# Patient Record
Sex: Female | Born: 1975 | Race: Black or African American | Hispanic: No | Marital: Single | Smoking: Current every day smoker
Health system: Southern US, Community
[De-identification: ages and names within clinical notes are randomized; demographics above are authoritative.]

## PROBLEM LIST (undated history)

## (undated) DIAGNOSIS — D649 Anemia, unspecified: Secondary | ICD-10-CM

## (undated) DIAGNOSIS — F419 Anxiety disorder, unspecified: Secondary | ICD-10-CM

## (undated) DIAGNOSIS — F4310 Post-traumatic stress disorder, unspecified: Secondary | ICD-10-CM

---

## 1998-02-05 DIAGNOSIS — D649 Anemia, unspecified: Secondary | ICD-10-CM

## 1998-02-05 HISTORY — DX: Anemia, unspecified: D64.9

## 2015-07-29 ENCOUNTER — Emergency Department
Admission: EM | Admit: 2015-07-29 | Discharge: 2015-07-29 | Disposition: A | Discharge status: Home / Self Care | Payer: Medicaid Other | Attending: Emergency Medicine | Admitting: Emergency Medicine

## 2015-07-29 ENCOUNTER — Emergency Department: Payer: Medicaid Other

## 2015-07-29 ENCOUNTER — Encounter: Payer: Self-pay | Admitting: Medical Oncology

## 2015-07-29 DIAGNOSIS — K8050 Calculus of bile duct without cholangitis or cholecystitis without obstruction: Secondary | ICD-10-CM | POA: Insufficient documentation

## 2015-07-29 DIAGNOSIS — R1011 Right upper quadrant pain: Secondary | ICD-10-CM | POA: Diagnosis present

## 2015-07-29 DIAGNOSIS — R109 Unspecified abdominal pain: Secondary | ICD-10-CM

## 2015-07-29 HISTORY — DX: Anemia, unspecified: D64.9

## 2015-07-29 LAB — URINALYSIS COMPLETE WITH MICROSCOPIC (ARMC ONLY)
Bacteria, UA: NONE SEEN
Bilirubin Urine: NEGATIVE
Glucose, UA: NEGATIVE mg/dL
KETONES UR: NEGATIVE mg/dL
Nitrite: NEGATIVE
PH: 5 (ref 5.0–8.0)
PROTEIN: NEGATIVE mg/dL
Specific Gravity, Urine: 1.025 (ref 1.005–1.030)

## 2015-07-29 LAB — COMPREHENSIVE METABOLIC PANEL
ALT: 22 U/L (ref 14–54)
ANION GAP: 8 (ref 5–15)
AST: 24 U/L (ref 15–41)
Albumin: 4.4 g/dL (ref 3.5–5.0)
Alkaline Phosphatase: 64 U/L (ref 38–126)
BUN: 16 mg/dL (ref 6–20)
CALCIUM: 9.4 mg/dL (ref 8.9–10.3)
CO2: 20 mmol/L — AB (ref 22–32)
CREATININE: 0.74 mg/dL (ref 0.44–1.00)
Chloride: 110 mmol/L (ref 101–111)
GLUCOSE: 104 mg/dL — AB (ref 65–99)
POTASSIUM: 4.2 mmol/L (ref 3.5–5.1)
Sodium: 138 mmol/L (ref 135–145)
TOTAL PROTEIN: 8.2 g/dL — AB (ref 6.5–8.1)
Total Bilirubin: 0.4 mg/dL (ref 0.3–1.2)

## 2015-07-29 LAB — CBC
HCT: 34.6 % — ABNORMAL LOW (ref 35.0–47.0)
Hemoglobin: 11.1 g/dL — ABNORMAL LOW (ref 12.0–16.0)
MCH: 25.2 pg — AB (ref 26.0–34.0)
MCHC: 32 g/dL (ref 32.0–36.0)
MCV: 78.7 fL — ABNORMAL LOW (ref 80.0–100.0)
PLATELETS: 353 10*3/uL (ref 150–440)
RBC: 4.39 MIL/uL (ref 3.80–5.20)
RDW: 19.6 % — AB (ref 11.5–14.5)
WBC: 6.5 10*3/uL (ref 3.6–11.0)

## 2015-07-29 LAB — PREGNANCY, URINE: Preg Test, Ur: NEGATIVE

## 2015-07-29 LAB — LIPASE, BLOOD: LIPASE: 29 U/L (ref 11–51)

## 2015-07-29 MED ORDER — MORPHINE SULFATE (PF) 4 MG/ML IV SOLN
4.0000 mg | Freq: Once | INTRAVENOUS | Status: AC
  Administered 2015-07-29: 4 mg via INTRAVENOUS
  Filled 2015-07-29: qty 1

## 2015-07-29 MED ORDER — OXYCODONE-ACETAMINOPHEN 5-325 MG PO TABS
ORAL_TABLET | ORAL | Status: AC
  Administered 2015-07-29: 1 via ORAL
  Filled 2015-07-29: qty 1

## 2015-07-29 MED ORDER — ONDANSETRON HCL 4 MG/2ML IJ SOLN
4.0000 mg | Freq: Once | INTRAMUSCULAR | Status: AC
  Administered 2015-07-29: 4 mg via INTRAVENOUS
  Filled 2015-07-29: qty 2

## 2015-07-29 MED ORDER — SODIUM CHLORIDE 0.9 % IV BOLUS (SEPSIS)
1000.0000 mL | Freq: Once | INTRAVENOUS | Status: AC
  Administered 2015-07-29: 1000 mL via INTRAVENOUS

## 2015-07-29 MED ORDER — OXYCODONE-ACETAMINOPHEN 5-325 MG PO TABS
1.0000 | ORAL_TABLET | Freq: Once | ORAL | Status: AC
  Administered 2015-07-29: 1 via ORAL

## 2015-07-29 MED ORDER — ONDANSETRON HCL 4 MG PO TABS: 4.0000 mg | ORAL_TABLET | Freq: Every day | ORAL | Status: DC | PRN

## 2015-07-29 MED ORDER — OXYCODONE-ACETAMINOPHEN 5-325 MG PO TABS: 1.0000 | ORAL_TABLET | Freq: Four times a day (QID) | ORAL | Status: DC | PRN

## 2015-07-29 NOTE — ED Notes (Signed)
Discharge instructions reviewed with patient. RN stressed the importance of following up with surgery. Patient verbalized understanding. Patient ambulated to lobby without difficulty.

## 2015-07-29 NOTE — Discharge Instructions (Signed)
You would prefer to go home today. This is not unreasonable but if you have increased pain, vomiting, fever, or you feel worse in any way return to the emergency room. Do not fail to follow-up with surgery.

## 2015-07-29 NOTE — ED Notes (Signed)
Pt taken to ultrasound via stretcher by ultrasound staff.

## 2015-07-29 NOTE — ED Notes (Signed)
Pt reports upper abd pain that began last night with nausea.

## 2015-07-29 NOTE — ED Notes (Signed)
Upper abdominal pain that began yesterday; cough and congestion X 2 days ago that has gotten better. Nausea yesterday. Pt alert and oriented X4, active, cooperative, pt in NAD. RR even and unlabored, color WNL.

## 2015-07-29 NOTE — ED Provider Notes (Addendum)
[facility] Emergency Department Provider Note  ____________________________________________   I have reviewed the triage vital signs and the nursing notes.   HISTORY  Chief Complaint Abdominal Pain and Nausea    HPI Christy Williams is a 40 y.o. female presents today with right upper quadrant pain. She states she vomited and had some loose stools 2 days ago, those symptoms that seem to have improved, she is somewhat nauseated, and then she began having right upper quadrant pain last night. Patient had gallstones when she was pertinent "20 years ago" but never had surgery. She states that the gallstones may have passed. Her only prior surgeries are C-section. She is otherwise healthy. She's not having chest pain shortness of breath. She did have a slight cough and runny nose a few days ago but that is better.      Past Medical History  Diagnosis Date  . Anemia     There are no active problems to display for this patient.   Past Surgical History  Procedure Laterality Date  . Cesarean section      No current outpatient prescriptions on file.  Allergies Penicillins  No family history on file.  Social History Social History  Substance Use Topics  . Smoking status: None  . Smokeless tobacco: None  . Alcohol Use: None    Review of Systems Constitutional: No fever/chills Eyes: No visual changes. ENT: No sore throat. No stiff neck no neck pain Cardiovascular: Denies chest pain. Respiratory: Denies shortness of breath. Gastrointestinal:   See history of present illness Genitourinary: Negative for dysuria. Musculoskeletal: Negative lower extremity swelling Skin: Negative for rash. Neurological: Negative for headaches, focal weakness or numbness. 10-point ROS otherwise negative.  ____________________________________________   PHYSICAL EXAM:  VITAL SIGNS: ED Triage Vitals  Enc Vitals Group     BP 07/29/15 1729 138/102 mmHg     Pulse  Rate 07/29/15 1729 70     Resp 07/29/15 1729 18     Temp 07/29/15 1729 98 F (36.7 C)     Temp Source 07/29/15 1729 Oral     SpO2 07/29/15 1729 99 %     Weight 07/29/15 1729 183 lb (83.008 kg)     Height 07/29/15 1729 5\' 4"  (1.626 m)     Head Cir --      Peak Flow --      Pain Score 07/29/15 1730 10     Pain Loc --      Pain Edu? --      Excl. in GC? --     Constitutional: Alert and oriented. Well appearing and in no acute distress. Eyes: Conjunctivae are normal. PERRL. EOMI. Head: Atraumatic. Nose: No congestion/rhinnorhea. Mouth/Throat: Mucous membranes are moist.  Oropharynx non-erythematous. Neck: No stridor.   Nontender with no meningismus Cardiovascular: Normal rate, regular rhythm. Grossly normal heart sounds.  Good peripheral circulation. Respiratory: Normal respiratory effort.  No retractions. Lungs CTAB. Abdominal: Soft and Focally tender to palpation in the right upper quadrant. No distention. No guarding no rebound palpation reproduces the patient's pain Back:  There is no focal tenderness or step off there is no midline tenderness there are no lesions noted. there is no CVA tenderness Musculoskeletal: No lower extremity tenderness. No joint effusions, no DVT signs strong distal pulses no edema Neurologic:  Normal speech and language. No gross focal neurologic deficits are appreciated.  Skin:  Skin is warm, dry and intact. No rash noted. Psychiatric: Mood and affect are normal. Speech and behavior are  normal.  ____________________________________________   LABS (all labs ordered are listed, but only abnormal results are displayed)  Labs Reviewed  COMPREHENSIVE METABOLIC PANEL - Abnormal; Notable for the following:    CO2 20 (*)    Glucose, Bld 104 (*)    Total Protein 8.2 (*)    All other components within normal limits  CBC - Abnormal; Notable for the following:    Hemoglobin 11.1 (*)    HCT 34.6 (*)    MCV 78.7 (*)    MCH 25.2 (*)    RDW 19.6 (*)    All  other components within normal limits  LIPASE, BLOOD  URINALYSIS COMPLETEWITH MICROSCOPIC ([facility] ONLY)  PREGNANCY, URINE   ____________________________________________  EKG  I personally interpreted any EKGs ordered by me or triage Normal sinus rhythm rate 71 per minute no acute ST elevation or depression normal axis, ____________________________________________  RADIOLOGY  I reviewed any imaging ordered by me or triage that were performed during my shift and, if possible, patient and/or family made aware of any abnormal findings. ____________________________________________   PROCEDURES  Procedure(s) performed: None  Critical Care performed: None  ____________________________________________   INITIAL IMPRESSION / ASSESSMENT AND PLAN / ED COURSE  Pertinent labs & imaging results that were available during my care of the patient were reviewed by me and considered in my medical decision making (see chart for details).  Patient today with reducible right upper quadrant pain and a history of recent vomiting. Nothing at this time indicate ACS or PE. Patient is very reproducible abdominal pain and history of gallstones. We will obtain ultrasound and reassess.  ----------------------------------------- 9:10 PM on 07/29/2015 -----------------------------------------  Patient's pain is controlled tolerating by mouth, I discussed with Dr. Excell Seltzerooper including patient's findings on ultrasound as well as vital signs etc. and exam. He feels patient if she feels adequately pain controlled and nausea controlled can go home with outpatient follow-up. We discussed with the patient and did offer her inpatient treatment today, and she declined preferring to go home. Extensive return precautions and follow-up given and understood. ____________________________________________   FINAL CLINICAL IMPRESSION(S) / ED DIAGNOSES  Final diagnoses:  Abdominal pain      This chart was dictated using  voice recognition software.  Despite best efforts to proofread,  errors can occur which can change meaning.     Jeanmarie PlantJames A McShane, MD 07/29/15 1902  Jeanmarie PlantJames A McShane, MD 07/29/15 2111

## 2015-08-12 ENCOUNTER — Ambulatory Visit: Payer: Self-pay | Admitting: General Surgery

## 2015-09-02 ENCOUNTER — Encounter: Payer: Self-pay | Admitting: Sports Medicine

## 2015-09-02 ENCOUNTER — Ambulatory Visit: Payer: Self-pay | Admitting: Sports Medicine

## 2015-09-15 ENCOUNTER — Ambulatory Visit: Payer: Self-pay | Admitting: General Surgery

## 2015-12-26 ENCOUNTER — Telehealth: Payer: Self-pay | Admitting: General Surgery

## 2015-12-26 ENCOUNTER — Encounter: Payer: Self-pay | Admitting: General Surgery

## 2015-12-26 NOTE — Telephone Encounter (Signed)
Patient called in & stated she was seen in the E.D. June 2017 & wanted to follow up here in our office.Dr Evette CristalSankar is on call today for our office.I have written him a note with patients ED visit note & her abd u/s to see if he will be willing to see her.The infomation was placed on his keyboard.

## 2015-12-26 NOTE — Telephone Encounter (Signed)
Verbal per Dr. Evette CristalSankar schedule patient an appointment.I will call & make appointment.

## 2016-01-03 ENCOUNTER — Ambulatory Visit
Admission: RE | Admit: 2016-01-03 | Discharge: 2016-01-03 | Disposition: A | Discharge status: Home / Self Care | Payer: Medicaid Other | Source: Ambulatory Visit | Attending: General Surgery | Admitting: General Surgery

## 2016-01-03 ENCOUNTER — Other Ambulatory Visit
Admission: RE | Admit: 2016-01-03 | Discharge: 2016-01-03 | Disposition: A | Discharge status: Home / Self Care | Payer: Medicaid Other | Source: Ambulatory Visit | Attending: General Surgery | Admitting: General Surgery

## 2016-01-03 ENCOUNTER — Telehealth: Payer: Self-pay | Admitting: *Deleted

## 2016-01-03 ENCOUNTER — Emergency Department
Admission: EM | Admit: 2016-01-03 | Discharge: 2016-01-03 | Discharge status: Absent without leave | Payer: Medicaid Other

## 2016-01-03 ENCOUNTER — Ambulatory Visit (INDEPENDENT_AMBULATORY_CARE_PROVIDER_SITE_OTHER): Payer: Medicaid Other | Admitting: General Surgery

## 2016-01-03 ENCOUNTER — Other Ambulatory Visit: Payer: Medicaid Other

## 2016-01-03 ENCOUNTER — Encounter: Payer: Self-pay | Admitting: General Surgery

## 2016-01-03 VITALS — BP 128/78 | HR 70 | Resp 12 | Ht 64.0 in | Wt 195.0 lb

## 2016-01-03 DIAGNOSIS — K8000 Calculus of gallbladder with acute cholecystitis without obstruction: Secondary | ICD-10-CM | POA: Diagnosis not present

## 2016-01-03 DIAGNOSIS — R109 Unspecified abdominal pain: Secondary | ICD-10-CM | POA: Diagnosis not present

## 2016-01-03 DIAGNOSIS — R1011 Right upper quadrant pain: Secondary | ICD-10-CM

## 2016-01-03 LAB — CBC WITH DIFFERENTIAL/PLATELET
Basophils Absolute: 0.1 10*3/uL (ref 0–0.1)
Basophils Relative: 1 %
EOS ABS: 0.1 10*3/uL (ref 0–0.7)
EOS PCT: 2 %
HCT: 33.3 % — ABNORMAL LOW (ref 35.0–47.0)
Hemoglobin: 10.8 g/dL — ABNORMAL LOW (ref 12.0–16.0)
LYMPHS ABS: 1.5 10*3/uL (ref 1.0–3.6)
LYMPHS PCT: 26 %
MCH: 24.9 pg — AB (ref 26.0–34.0)
MCHC: 32.4 g/dL (ref 32.0–36.0)
MCV: 76.6 fL — AB (ref 80.0–100.0)
MONO ABS: 0.6 10*3/uL (ref 0.2–0.9)
MONOS PCT: 10 %
Neutro Abs: 3.4 10*3/uL (ref 1.4–6.5)
Neutrophils Relative %: 61 %
PLATELETS: 340 10*3/uL (ref 150–440)
RBC: 4.34 MIL/uL (ref 3.80–5.20)
RDW: 20.3 % — ABNORMAL HIGH (ref 11.5–14.5)
WBC: 5.7 10*3/uL (ref 3.6–11.0)

## 2016-01-03 LAB — HEPATIC FUNCTION PANEL
ALT: 51 U/L (ref 14–54)
AST: 36 U/L (ref 15–41)
Albumin: 4.1 g/dL (ref 3.5–5.0)
Alkaline Phosphatase: 48 U/L (ref 38–126)
Total Bilirubin: 0.5 mg/dL (ref 0.3–1.2)
Total Protein: 7.6 g/dL (ref 6.5–8.1)

## 2016-01-03 LAB — LIPASE, BLOOD: Lipase: 57 U/L — ABNORMAL HIGH (ref 11–51)

## 2016-01-03 NOTE — Patient Instructions (Addendum)
This information has been scribed by Dorathy DaftMarsha Denishia Citro RN, BSN,BC.  L[facility] Gall Bladder ultrasound today   Laparoscopic Cholecystectomy Laparoscopic cholecystectomy is surgery to remove the gallbladder. The gallbladder is a pear-shaped organ that lies beneath the liver on the right side of the body. The gallbladder stores bile, which is a fluid that helps the body to digest fats. Cholecystectomy is often done for inflammation of the gallbladder (cholecystitis). This condition is usually caused by a buildup of gallstones (cholelithiasis) in the gallbladder. Gallstones can block the flow of bile, which can result in inflammation and pain. In severe cases, emergency surgery may be required. This procedure is done though small incisions in your abdomen (laparoscopic surgery). A thin scope with a camera (laparoscope) is inserted through one incision. Thin surgical instruments are inserted through the other incisions. In some cases, a laparoscopic procedure may be turned into a type of surgery that is done through a larger incision (open surgery). Tell a health care provider about:  Any allergies you have.  All medicines you are taking, including vitamins, herbs, eye drops, creams, and over-the-counter medicines.  Any problems you or family members have had with anesthetic medicines.  Any blood disorders you have.  Any surgeries you have had.  Any medical conditions you have.  Whether you are pregnant or may be pregnant. What are the risks? Generally, this is a safe procedure. However, problems may occur, including:  Infection.  Bleeding.  Allergic reactions to medicines.  Damage to other structures or organs.  A stone remaining in the common bile duct. The common bile duct carries bile from the gallbladder into the small intestine.  A bile leak from the cyst duct that is clipped when your gallbladder is removed. What happens before the procedure? Staying hydrated   Follow instructions from your health care provider about hydration, which may include:  Up to 2 hours before the procedure - you may continue to drink clear liquids, such as water, clear fruit juice, black coffee, and plain tea. Eating and drinking restrictions  Follow instructions from your health care provider about eating and drinking, which may include:  8 hours before the procedure - stop eating heavy meals or foods such as meat, fried foods, or fatty foods.  6 hours before the procedure - stop eating light meals or foods, such as toast or cereal.  6 hours before the procedure - stop drinking milk or drinks that contain milk.  2 hours before the procedure - stop drinking clear liquids. Medicines  Ask your health care provider about:  Changing or stopping your regular medicines. This is especially important if you are taking diabetes medicines or blood thinners.  Taking medicines such as aspirin and ibuprofen. These medicines can thin your blood. Do not take these medicines before your procedure if your health care provider instructs you not to.  You may be given antibiotic medicine to help prevent infection. General instructions  Let your health care provider know if you develop a cold or an infection before surgery.  Plan to have someone take you home from the hospital or clinic.  Ask your health care provider how your surgical site will be marked or identified. What happens during the procedure?  To reduce your risk of infection:  Your health care team will wash or sanitize their hands.  Your skin will be washed with soap.  Hair may be removed from the surgical area.  An IV tube may be inserted into one  of your veins.  You will be given one or more of the following:  A medicine to help you relax (sedative).  A medicine to make you fall asleep (general anesthetic).  A breathing tube will be placed in your mouth.  Your surgeon will make several small cuts  (incisions) in your abdomen.  The laparoscope will be inserted through one of the small incisions. The camera on the laparoscope will send images to a TV screen (monitor) in the operating room. This lets your surgeon see inside your abdomen.  Air-like gas will be pumped into your abdomen. This will expand your abdomen to give the surgeon more room to perform the surgery.  Other tools that are needed for the procedure will be inserted through the other incisions. The gallbladder will be removed through one of the incisions.  Your common bile duct may be examined. If stones are found in the common bile duct, they may be removed.  After your gallbladder has been removed, the incisions will be closed with stitches (sutures), staples, or skin glue.  Your incisions may be covered with a bandage (dressing). The procedure may vary among health care providers and hospitals. What happens after the procedure?  Your blood pressure, heart rate, breathing rate, and blood oxygen level will be monitored until the medicines you were given have worn off.  You will be given medicines as needed to control your pain.  Do not drive for 24 hours if you were given a sedative. This information is not intended to replace advice given to you by your health care provider. Make sure you discuss any questions you have with your health care provider. Document Released: 01/22/2005 Document Revised: 08/14/2015 Document Reviewed: 07/11/2015 Elsevier Interactive Patient Education  2017 [employer]  The patient is scheduled for a Gallbladder ultrasound and [facility] today at 2:30 pm. She will arrive at 2:15 pm and have nothing to eat or drink prior. She will have labs done first. The patient is aware of date, time, and instructions.

## 2016-01-03 NOTE — Progress Notes (Signed)
Patient ID: Christy Williams, female   DOB: 06-10-1975, [age] y.o.   MRN: [id number]  Chief Complaint  Patient presents with  . Follow-up    ED visit in June  . Abdominal Pain    HPI Christy Williams is a 40 y.o. female.  Here today for follow up from hospital visit for biliary colic back in June.  She states she had issues with abdominal pain and her gall bladder with her first pregnancy back in 1996. She states the pain was upper right abdomen lasting about a week. Her most recent episode of abdominal pain was 2 days ago associated with nausea but no vomiting. She states the pain started during the night and is radiating to her backl. She does not feel it is associated with any particular foods. She had an abdominal ultrasound on 07-29-15.  I have reviewed the history of present illness with the patient.  HPI  Past Medical History:  Diagnosis Date  . Anemia 2000    Past Surgical History:  Procedure Laterality Date  . CESAREAN SECTION  2008    Family History  Problem Relation Age of Onset  . Breast cancer Maternal Grandmother     Social History Social History  Substance Use Topics  . Smoking status: Current Every Day Smoker    Packs/day: 0.25    Years: 10.00  . Smokeless tobacco: Never Used  . Alcohol use Yes    Allergies  Allergen Reactions  . Penicillins Itching    Current Outpatient Prescriptions  Medication Sig Dispense Refill  . aspirin 325 MG tablet Take 325 mg by mouth every 4 (four) hours as needed.    . diphenhydrAMINE (BENADRYL) 25 MG tablet Take 25 mg by mouth every 6 (six) hours as needed.     No current facility-administered medications for this visit.     Review of Systems Review of Systems  Constitutional: Negative.   Respiratory: Negative.   Cardiovascular: Negative.   Gastrointestinal: Positive for abdominal pain, constipation and nausea. Negative for vomiting.    Blood pressure 128/78, pulse 70, resp. rate 12, height 5\' 4"  (1.626 m),  weight 195 lb (88.5 kg), last menstrual period 12/20/2015.  Physical Exam Physical Exam  Constitutional: She is oriented to person, place, and time. She appears well-developed and well-nourished.  HENT:  Mouth/Throat: Oropharynx is clear and moist.  Eyes: Conjunctivae are normal. No scleral icterus.  Neck: Neck supple.  Cardiovascular: Normal rate, regular rhythm and normal heart sounds.   Pulmonary/Chest: Effort normal and breath sounds normal.  Abdominal: Soft. Normal appearance and bowel sounds are normal. There is tenderness in the right upper quadrant.  Lymphadenopathy:    She has no cervical adenopathy.  Neurological: She is alert and oriented to person, place, and time.  Skin: Skin is warm and dry.  Psychiatric: Her behavior is normal.    Data Reviewed Progress notes and ultrasound.  Assessment    Cholelithasis with possible acute cholecystitis     Plan    L[facility] Gall Bladder ultrasound today last oral intake was 8:30 am. If US and or labs suggests acute cholecystitis will plan for cholecystectomy tomorrow. If no acute changes will schedule elective cholecystectomy. Pt advised fully  Laparoscopic Cholecystectomy with Intraoperative Cholangiogram. The procedure, including it's potential risks and complications (including but not limited to infection, bleeding, injury to intra-abdominal organs or bile ducts, bile leak, poor cosmetic result, sepsis and death) were discussed with the patient in detail. Non-operative options, including  their inherent risks (acute calculous cholecystitis with possible choledocholithiasis or gallstone pancreatitis, with the risk of ascending cholangitis, sepsis, and death) were discussed as well. The patient expressed and understanding of what we discussed and wishes to proceed with laparoscopic cholecystectomy. The patient further understands that if it is technically not possible, or it is unsafe to proceed laparoscopically, that I  will convert to an open cholecystectomy.     The patient is scheduled for a Gallbladder ultrasound and [facility] today at 2:30 pm. She will arrive at 2:15 pm and have nothing to eat or drink prior. She will have labs done first. The patient is aware of date, time, and instructions.  This information has been scribed by Dorathy DaftMarsha Hatch RN, BSN,BC.   [name] 01/03/2016, 1:01 PM

## 2016-01-03 NOTE — Telephone Encounter (Signed)
Inform patient that the labs were stable per Dr Evette CristalSankar, awaiting on CT scan results tomorrow.

## 2016-01-04 ENCOUNTER — Ambulatory Visit: Payer: Medicaid Other

## 2016-01-04 NOTE — Telephone Encounter (Signed)
Radiology called and said that she failed to keep her ultrasound appointment, Dr Evette CristalSankar aware and aware that messages have been left for pt to return call.

## 2016-03-10 ENCOUNTER — Emergency Department
Admission: EM | Admit: 2016-03-10 | Discharge: 2016-03-10 | Disposition: A | Discharge status: Home / Self Care | Payer: Medicaid Other | Attending: Emergency Medicine | Admitting: Emergency Medicine

## 2016-03-10 ENCOUNTER — Encounter: Payer: Self-pay | Admitting: Emergency Medicine

## 2016-03-10 ENCOUNTER — Emergency Department: Payer: Medicaid Other

## 2016-03-10 DIAGNOSIS — Y939 Activity, unspecified: Secondary | ICD-10-CM | POA: Diagnosis not present

## 2016-03-10 DIAGNOSIS — S6291XA Unspecified fracture of right wrist and hand, initial encounter for closed fracture: Secondary | ICD-10-CM | POA: Insufficient documentation

## 2016-03-10 DIAGNOSIS — F10120 Alcohol abuse with intoxication, uncomplicated: Secondary | ICD-10-CM | POA: Insufficient documentation

## 2016-03-10 DIAGNOSIS — S62101A Fracture of unspecified carpal bone, right wrist, initial encounter for closed fracture: Secondary | ICD-10-CM

## 2016-03-10 DIAGNOSIS — F10920 Alcohol use, unspecified with intoxication, uncomplicated: Secondary | ICD-10-CM

## 2016-03-10 DIAGNOSIS — S0990XA Unspecified injury of head, initial encounter: Secondary | ICD-10-CM | POA: Insufficient documentation

## 2016-03-10 DIAGNOSIS — F17200 Nicotine dependence, unspecified, uncomplicated: Secondary | ICD-10-CM | POA: Diagnosis not present

## 2016-03-10 DIAGNOSIS — S5291XA Unspecified fracture of right forearm, initial encounter for closed fracture: Secondary | ICD-10-CM

## 2016-03-10 DIAGNOSIS — Y92410 Unspecified street and highway as the place of occurrence of the external cause: Secondary | ICD-10-CM | POA: Insufficient documentation

## 2016-03-10 DIAGNOSIS — Y999 Unspecified external cause status: Secondary | ICD-10-CM | POA: Diagnosis not present

## 2016-03-10 DIAGNOSIS — T148XXA Other injury of unspecified body region, initial encounter: Secondary | ICD-10-CM

## 2016-03-10 DIAGNOSIS — S0081XA Abrasion of other part of head, initial encounter: Secondary | ICD-10-CM | POA: Diagnosis not present

## 2016-03-10 LAB — COMPREHENSIVE METABOLIC PANEL
ALT: 29 U/L (ref 14–54)
ANION GAP: 7 (ref 5–15)
AST: 53 U/L — ABNORMAL HIGH (ref 15–41)
Albumin: 4.2 g/dL (ref 3.5–5.0)
Alkaline Phosphatase: 50 U/L (ref 38–126)
BUN: 9 mg/dL (ref 6–20)
CHLORIDE: 110 mmol/L (ref 101–111)
CO2: 22 mmol/L (ref 22–32)
CREATININE: 0.74 mg/dL (ref 0.44–1.00)
Calcium: 8.5 mg/dL — ABNORMAL LOW (ref 8.9–10.3)
Glucose, Bld: 86 mg/dL (ref 65–99)
POTASSIUM: 3.6 mmol/L (ref 3.5–5.1)
SODIUM: 139 mmol/L (ref 135–145)
Total Bilirubin: 0.4 mg/dL (ref 0.3–1.2)
Total Protein: 8 g/dL (ref 6.5–8.1)

## 2016-03-10 LAB — CBC WITH DIFFERENTIAL/PLATELET
Basophils Absolute: 0.1 10*3/uL (ref 0–0.1)
Basophils Relative: 1 %
EOS PCT: 5 %
Eosinophils Absolute: 0.3 10*3/uL (ref 0–0.7)
HCT: 32 % — ABNORMAL LOW (ref 35.0–47.0)
Hemoglobin: 10.3 g/dL — ABNORMAL LOW (ref 12.0–16.0)
LYMPHS ABS: 2.8 10*3/uL (ref 1.0–3.6)
LYMPHS PCT: 44 %
MCH: 24.4 pg — AB (ref 26.0–34.0)
MCHC: 32.3 g/dL (ref 32.0–36.0)
MCV: 75.7 fL — AB (ref 80.0–100.0)
MONO ABS: 0.5 10*3/uL (ref 0.2–0.9)
Monocytes Relative: 9 %
Neutro Abs: 2.6 10*3/uL (ref 1.4–6.5)
Neutrophils Relative %: 41 %
PLATELETS: 441 10*3/uL — AB (ref 150–440)
RBC: 4.23 MIL/uL (ref 3.80–5.20)
RDW: 23.1 % — AB (ref 11.5–14.5)
WBC: 6.4 10*3/uL (ref 3.6–11.0)

## 2016-03-10 LAB — ETHANOL: ALCOHOL ETHYL (B): 267 mg/dL — AB (ref <5)

## 2016-03-10 MED ORDER — HALOPERIDOL LACTATE 5 MG/ML IJ SOLN
INTRAMUSCULAR | Status: AC
  Administered 2016-03-10: 2.5 mg via INTRAVENOUS
  Filled 2016-03-10: qty 1

## 2016-03-10 MED ORDER — HALOPERIDOL LACTATE 5 MG/ML IJ SOLN
2.5000 mg | Freq: Once | INTRAMUSCULAR | Status: AC
  Administered 2016-03-10: 2.5 mg via INTRAVENOUS

## 2016-03-10 MED ORDER — HYDROCODONE-ACETAMINOPHEN 5-325 MG PO TABS: 1.0000 | ORAL_TABLET | Freq: Four times a day (QID) | ORAL | 0 refills | Status: DC | PRN

## 2016-03-10 MED ORDER — LIDOCAINE HCL (PF) 1 % IJ SOLN
30.0000 mL | Freq: Once | INTRAMUSCULAR | Status: AC
  Administered 2016-03-10: 20 mL
  Filled 2016-03-10: qty 30

## 2016-03-10 MED ORDER — LIDOCAINE HCL (PF) 1 % IJ SOLN
INTRAMUSCULAR | Status: AC
  Filled 2016-03-10: qty 20

## 2016-03-10 MED ORDER — MORPHINE SULFATE (PF) 4 MG/ML IV SOLN
4.0000 mg | Freq: Once | INTRAVENOUS | Status: DC
  Filled 2016-03-10: qty 1

## 2016-03-10 MED ORDER — ONDANSETRON HCL 4 MG/2ML IJ SOLN
4.0000 mg | Freq: Once | INTRAMUSCULAR | Status: DC
Start: 2016-03-10 — End: 2016-03-10
  Filled 2016-03-10: qty 2

## 2016-03-10 MED ORDER — LIDOCAINE HCL (PF) 1 % IJ SOLN
INTRAMUSCULAR | Status: AC
  Filled 2016-03-10: qty 10

## 2016-03-10 MED ORDER — TETRACAINE HCL 1 % IJ SOLN
10.0000 mg | Freq: Once | INTRAMUSCULAR | Status: AC
Start: 2016-03-10 — End: 2016-03-10
  Administered 2016-03-10: 1 mL
  Filled 2016-03-10: qty 2

## 2016-03-10 MED ORDER — LIDOCAINE HCL 2 % IJ SOLN: 15.0000 mL | Freq: Once | INTRAMUSCULAR | Status: DC

## 2016-03-10 MED ORDER — SODIUM CHLORIDE 0.9 % IV BOLUS (SEPSIS)
1000.0000 mL | Freq: Once | INTRAVENOUS | Status: AC
  Administered 2016-03-10: 1000 mL via INTRAVENOUS
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
## 2016-03-10 NOTE — ED Notes (Signed)
Pt refuses to let this RN collect blood from the inserted IV. Pt states "you've already got my blood." When explaining to the patient that no blood has been drawn yet, and we have just inserted an IV catheter into the patient.  Pt reuses to let this RN collect blood. Charge RN called to talk with the patient and try to convince her to let the patient collect blood samples. Charge RN was unsuccessful in her attempt to convince the pt to let us draw blood. Another staff RN Sue Lush(Andrea) was able to convince the pt to draw blood from the IV.

## 2016-03-10 NOTE — ED Notes (Signed)
Pt returned from imaging.
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
## 2016-03-10 NOTE — ED Notes (Signed)
After noting the patient pacing in the room, this RN again asked if the patient wanted pain medication and the Pt answered by asking "is that a clean needle?" After speaking with the patient and convincing her that this hospital uses clean, sealed supplies, pt agreed to have some pain medication and agreed to go have imaging done.

## 2016-03-10 NOTE — Discharge Instructions (Addendum)
1. You may take pain medicines as needed (Motrin) 2. Keep splint clean and dry. 3. Elevate affected area and apply ice over splint several times daily. 4. Return to the ER for worsening symptoms, increased swelling, numbness/tingling, persistent vomiting, lethargy or other concerns.

## 2016-03-10 NOTE — ED Notes (Signed)
Pt went to CT scan.

## 2016-03-10 NOTE — ED Notes (Signed)
Tetracaine 1% not available in ED pyxis, called pharmacy to have them send medication to ED.

## 2016-03-10 NOTE — ED Notes (Signed)
Pt seen walking to the room's door and back into the room.  When asked by this RN if the pt wants her pain medication, pt refuses staring blankly at this RN.
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
## 2016-03-27 ENCOUNTER — Other Ambulatory Visit: Payer: Self-pay | Admitting: Specialist

## 2016-03-28 ENCOUNTER — Ambulatory Visit: Admission: RE | Admit: 2016-03-28 | Payer: Medicaid Other | Source: Ambulatory Visit | Admitting: Specialist

## 2016-03-28 ENCOUNTER — Encounter
Admission: EM | Disposition: A | Discharge status: Home / Self Care | Payer: Self-pay | Source: Home / Self Care | Attending: Student in an Organized Health Care Education/Training Program

## 2016-03-28 ENCOUNTER — Encounter: Payer: Self-pay | Admitting: Anesthesiology

## 2016-03-28 ENCOUNTER — Encounter: Payer: Self-pay | Admitting: *Deleted

## 2016-03-28 ENCOUNTER — Emergency Department
Admission: EM | Admit: 2016-03-28 | Discharge: 2016-03-28 | Disposition: A | Discharge status: Home / Self Care | Payer: Medicaid Other | Attending: Student in an Organized Health Care Education/Training Program | Admitting: Student in an Organized Health Care Education/Training Program

## 2016-03-28 DIAGNOSIS — S52531K Colles' fracture of right radius, subsequent encounter for closed fracture with nonunion: Secondary | ICD-10-CM | POA: Diagnosis not present

## 2016-03-28 DIAGNOSIS — Z0289 Encounter for other administrative examinations: Secondary | ICD-10-CM | POA: Diagnosis not present

## 2016-03-28 DIAGNOSIS — F17200 Nicotine dependence, unspecified, uncomplicated: Secondary | ICD-10-CM | POA: Diagnosis not present

## 2016-03-28 DIAGNOSIS — Z79899 Other long term (current) drug therapy: Secondary | ICD-10-CM | POA: Insufficient documentation

## 2016-03-28 DIAGNOSIS — Z7982 Long term (current) use of aspirin: Secondary | ICD-10-CM | POA: Diagnosis not present

## 2016-03-28 DIAGNOSIS — R451 Restlessness and agitation: Secondary | ICD-10-CM | POA: Diagnosis present

## 2016-03-28 DIAGNOSIS — Z008 Encounter for other general examination: Secondary | ICD-10-CM

## 2016-03-28 SURGERY — OPEN REDUCTION INTERNAL FIXATION (ORIF) DISTAL RADIUS FRACTURE
Anesthesia: Choice | Laterality: Right

## 2016-03-28 MED ORDER — ASPIRIN 81 MG PO CHEW
324.0000 mg | CHEWABLE_TABLET | Freq: Once | ORAL | Status: AC
  Administered 2016-03-28: 324 mg via ORAL
  Filled 2016-03-28: qty 4

## 2016-03-28 MED ORDER — NEOMYCIN-POLYMYXIN B GU 40-200000 IR SOLN
Status: AC
  Filled 2016-03-28: qty 2

## 2016-03-28 MED ORDER — BUPIVACAINE HCL (PF) 0.5 % IJ SOLN
INTRAMUSCULAR | Status: AC
  Filled 2016-03-28: qty 30
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
## 2016-03-28 NOTE — Discharge Instructions (Signed)
Will need surgery in 3-4 days for distal radius fracture.  Follow up with Dr. Hyacinth MeekerMiller or any other orthopedic surgeon available.

## 2016-03-28 NOTE — ED Notes (Signed)
Patient reports she hasn't had anything to eat for 2 days.
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
## 2016-04-03 ENCOUNTER — Other Ambulatory Visit: Payer: Self-pay | Admitting: Specialist

## 2016-04-04 ENCOUNTER — Ambulatory Visit: Payer: Medicaid Other | Admitting: Anesthesiology

## 2016-04-04 ENCOUNTER — Observation Stay
Admission: RE | Admit: 2016-04-04 | Discharge: 2016-04-05 | Payer: Medicaid Other | Source: Ambulatory Visit | Attending: Specialist | Admitting: Specialist

## 2016-04-04 ENCOUNTER — Encounter: Payer: Self-pay | Admitting: *Deleted

## 2016-04-04 ENCOUNTER — Encounter: Admission: RE | Payer: Self-pay | Source: Ambulatory Visit | Attending: Specialist

## 2016-04-04 ENCOUNTER — Observation Stay: Payer: Medicaid Other

## 2016-04-04 DIAGNOSIS — Z7982 Long term (current) use of aspirin: Secondary | ICD-10-CM | POA: Insufficient documentation

## 2016-04-04 DIAGNOSIS — Z880 Allergy status to penicillin: Secondary | ICD-10-CM | POA: Diagnosis not present

## 2016-04-04 DIAGNOSIS — S52371A Galeazzi's fracture of right radius, initial encounter for closed fracture: Principal | ICD-10-CM | POA: Insufficient documentation

## 2016-04-04 DIAGNOSIS — F17200 Nicotine dependence, unspecified, uncomplicated: Secondary | ICD-10-CM | POA: Insufficient documentation

## 2016-04-04 DIAGNOSIS — S62101A Fracture of unspecified carpal bone, right wrist, initial encounter for closed fracture: Secondary | ICD-10-CM | POA: Diagnosis present

## 2016-04-04 DIAGNOSIS — X58XXXA Exposure to other specified factors, initial encounter: Secondary | ICD-10-CM | POA: Insufficient documentation

## 2016-04-04 DIAGNOSIS — Z23 Encounter for immunization: Secondary | ICD-10-CM | POA: Insufficient documentation

## 2016-04-04 HISTORY — PX: OPEN REDUCTION INTERNAL FIXATION (ORIF) WRIST WITH RADIAL STYLOIDECTOMY: SHX5671

## 2016-04-04 HISTORY — PX: OPEN REDUCTION INTERNAL FIXATION (ORIF) DISTAL RADIAL FRACTURE: SHX5989

## 2016-04-04 HISTORY — PX: CARPAL TUNNEL RELEASE: SHX101

## 2016-04-04 LAB — URINE DRUG SCREEN, QUALITATIVE (ARMC ONLY)
Amphetamines, Ur Screen: NOT DETECTED
BARBITURATES, UR SCREEN: NOT DETECTED
BENZODIAZEPINE, UR SCRN: NOT DETECTED
COCAINE METABOLITE, UR ~~LOC~~: NOT DETECTED
Cannabinoid 50 Ng, Ur ~~LOC~~: NOT DETECTED
MDMA (Ecstasy)Ur Screen: NOT DETECTED
Methadone Scn, Ur: NOT DETECTED
OPIATE, UR SCREEN: NOT DETECTED
PHENCYCLIDINE (PCP) UR S: NOT DETECTED
Tricyclic, Ur Screen: NOT DETECTED

## 2016-04-04 LAB — POCT PREGNANCY, URINE: Preg Test, Ur: NEGATIVE

## 2016-04-04 SURGERY — OPEN REDUCTION INTERNAL FIXATION (ORIF) WRIST WITH RADIAL STYLOIDECTOMY
Anesthesia: General | Laterality: Right

## 2016-04-04 MED ORDER — OXYCODONE HCL 5 MG/5ML PO SOLN: 5.0000 mg | Freq: Once | ORAL | Status: AC | PRN

## 2016-04-04 MED ORDER — MAGNESIUM HYDROXIDE 400 MG/5ML PO SUSP: 30.0000 mL | Freq: Every day | ORAL | Status: DC | PRN

## 2016-04-04 MED ORDER — FENTANYL CITRATE (PF) 100 MCG/2ML IJ SOLN
INTRAMUSCULAR | Status: AC
  Filled 2016-04-04: qty 2

## 2016-04-04 MED ORDER — CEFAZOLIN SODIUM-DEXTROSE 2-4 GM/100ML-% IV SOLN
INTRAVENOUS | Status: AC
  Filled 2016-04-04: qty 100

## 2016-04-04 MED ORDER — GLYCOPYRROLATE 0.2 MG/ML IJ SOLN
INTRAMUSCULAR | Status: DC | PRN
  Administered 2016-04-04: 0.2 mg via INTRAVENOUS

## 2016-04-04 MED ORDER — FENTANYL CITRATE (PF) 100 MCG/2ML IJ SOLN
INTRAMUSCULAR | Status: AC
  Administered 2016-04-04: 50 ug via INTRAVENOUS
  Filled 2016-04-04: qty 2

## 2016-04-04 MED ORDER — INFLUENZA VAC SPLIT QUAD 0.5 ML IM SUSY
0.5000 mL | PREFILLED_SYRINGE | INTRAMUSCULAR | Status: AC
  Administered 2016-04-05: 0.5 mL via INTRAMUSCULAR
  Filled 2016-04-04: qty 0.5

## 2016-04-04 MED ORDER — CEFAZOLIN SODIUM-DEXTROSE 2-4 GM/100ML-% IV SOLN: 2.0000 g | Freq: Four times a day (QID) | INTRAVENOUS | Status: DC

## 2016-04-04 MED ORDER — PROPOFOL 10 MG/ML IV BOLUS
INTRAVENOUS | Status: AC
  Filled 2016-04-04: qty 20

## 2016-04-04 MED ORDER — OXYCODONE HCL 5 MG PO TABS
5.0000 mg | ORAL_TABLET | Freq: Once | ORAL | Status: AC | PRN
  Administered 2016-04-04: 5 mg via ORAL

## 2016-04-04 MED ORDER — ONDANSETRON HCL 4 MG/2ML IJ SOLN
INTRAMUSCULAR | Status: DC | PRN
  Administered 2016-04-04: 4 mg via INTRAVENOUS

## 2016-04-04 MED ORDER — CHLORHEXIDINE GLUCONATE CLOTH 2 % EX PADS: 6.0000 | MEDICATED_PAD | Freq: Once | CUTANEOUS | Status: DC

## 2016-04-04 MED ORDER — PHENYLEPHRINE HCL 10 MG/ML IJ SOLN
INTRAMUSCULAR | Status: DC | PRN
  Administered 2016-04-04 (×4): 100 ug via INTRAVENOUS

## 2016-04-04 MED ORDER — NEOMYCIN-POLYMYXIN B GU 40-200000 IR SOLN
Status: AC
  Filled 2016-04-04: qty 2

## 2016-04-04 MED ORDER — CLINDAMYCIN PHOSPHATE 600 MG/50ML IV SOLN
600.0000 mg | INTRAVENOUS | Status: AC
  Administered 2016-04-04: 600 mg via INTRAVENOUS

## 2016-04-04 MED ORDER — GABAPENTIN 400 MG PO CAPS: 400.0000 mg | ORAL_CAPSULE | Freq: Once | ORAL | Status: DC

## 2016-04-04 MED ORDER — MELOXICAM 7.5 MG PO TABS: 15.0000 mg | ORAL_TABLET | Freq: Once | ORAL | Status: DC

## 2016-04-04 MED ORDER — GABAPENTIN 400 MG PO CAPS
400.0000 mg | ORAL_CAPSULE | Freq: Once | ORAL | Status: AC
  Administered 2016-04-04: 400 mg via ORAL

## 2016-04-04 MED ORDER — CEFAZOLIN SODIUM-DEXTROSE 2-3 GM-% IV SOLR
2.0000 g | Freq: Four times a day (QID) | INTRAVENOUS | Status: AC
  Administered 2016-04-04 – 2016-04-05 (×3): 2 g via INTRAVENOUS
  Filled 2016-04-04 (×3): qty 50

## 2016-04-04 MED ORDER — ONDANSETRON HCL 4 MG/2ML IJ SOLN: 4.0000 mg | Freq: Four times a day (QID) | INTRAMUSCULAR | Status: DC | PRN

## 2016-04-04 MED ORDER — NEOMYCIN-POLYMYXIN B GU 40-200000 IR SOLN
Status: DC | PRN
  Administered 2016-04-04: 2 mL

## 2016-04-04 MED ORDER — CELECOXIB 200 MG PO CAPS
200.0000 mg | ORAL_CAPSULE | Freq: Two times a day (BID) | ORAL | Status: DC
  Administered 2016-04-04 – 2016-04-05 (×2): 200 mg via ORAL
  Filled 2016-04-04 (×2): qty 1

## 2016-04-04 MED ORDER — MELOXICAM 7.5 MG PO TABS
15.0000 mg | ORAL_TABLET | Freq: Once | ORAL | Status: AC
  Administered 2016-04-04: 15 mg via ORAL

## 2016-04-04 MED ORDER — PROPOFOL 10 MG/ML IV BOLUS
INTRAVENOUS | Status: DC | PRN
  Administered 2016-04-04: 50 mg via INTRAVENOUS
  Administered 2016-04-04: 150 mg via INTRAVENOUS

## 2016-04-04 MED ORDER — METHOCARBAMOL 500 MG PO TABS
500.0000 mg | ORAL_TABLET | Freq: Four times a day (QID) | ORAL | Status: DC | PRN
  Administered 2016-04-04 – 2016-04-05 (×2): 500 mg via ORAL
  Filled 2016-04-04 (×2): qty 1

## 2016-04-04 MED ORDER — METOCLOPRAMIDE HCL 5 MG/ML IJ SOLN: 5.0000 mg | Freq: Three times a day (TID) | INTRAMUSCULAR | Status: DC | PRN

## 2016-04-04 MED ORDER — LACTATED RINGERS IV SOLN
INTRAVENOUS | Status: DC
  Administered 2016-04-04 (×2): via INTRAVENOUS

## 2016-04-04 MED ORDER — LIDOCAINE HCL (CARDIAC) 20 MG/ML IV SOLN
INTRAVENOUS | Status: DC | PRN
  Administered 2016-04-04: 80 mg via INTRAVENOUS

## 2016-04-04 MED ORDER — BUPIVACAINE HCL (PF) 0.5 % IJ SOLN
INTRAMUSCULAR | Status: AC
  Filled 2016-04-04: qty 30

## 2016-04-04 MED ORDER — PROMETHAZINE HCL 25 MG/ML IJ SOLN: 6.2500 mg | INTRAMUSCULAR | Status: DC | PRN

## 2016-04-04 MED ORDER — SODIUM CHLORIDE 0.45 % IV SOLN
INTRAVENOUS | Status: DC
  Administered 2016-04-04: 14:00:00 via INTRAVENOUS

## 2016-04-04 MED ORDER — MORPHINE SULFATE (PF) 2 MG/ML IV SOLN
1.0000 mg | INTRAVENOUS | Status: DC | PRN
  Administered 2016-04-04: 1 mg via INTRAVENOUS
  Filled 2016-04-04: qty 1

## 2016-04-04 MED ORDER — BUPIVACAINE HCL (PF) 0.5 % IJ SOLN
INTRAMUSCULAR | Status: DC | PRN
  Administered 2016-04-04: 30 mL

## 2016-04-04 MED ORDER — METHOCARBAMOL 1000 MG/10ML IJ SOLN
500.0000 mg | Freq: Four times a day (QID) | INTRAVENOUS | Status: DC | PRN
  Filled 2016-04-04: qty 5

## 2016-04-04 MED ORDER — GABAPENTIN 400 MG PO CAPS
ORAL_CAPSULE | ORAL | Status: AC
  Administered 2016-04-04: 400 mg via ORAL
  Filled 2016-04-04: qty 1

## 2016-04-04 MED ORDER — ACETAMINOPHEN 10 MG/ML IV SOLN
INTRAVENOUS | Status: AC
  Filled 2016-04-04: qty 100

## 2016-04-04 MED ORDER — MIDAZOLAM HCL 2 MG/2ML IJ SOLN
INTRAMUSCULAR | Status: AC
  Filled 2016-04-04: qty 2

## 2016-04-04 MED ORDER — ONDANSETRON HCL 4 MG PO TABS
4.0000 mg | ORAL_TABLET | Freq: Four times a day (QID) | ORAL | Status: DC | PRN
  Administered 2016-04-04: 4 mg via ORAL
  Filled 2016-04-04: qty 1

## 2016-04-04 MED ORDER — FLEET ENEMA 7-19 GM/118ML RE ENEM: 1.0000 | ENEMA | Freq: Once | RECTAL | Status: DC | PRN

## 2016-04-04 MED ORDER — DEXAMETHASONE SODIUM PHOSPHATE 10 MG/ML IJ SOLN
INTRAMUSCULAR | Status: DC | PRN
  Administered 2016-04-04: 5 mg via INTRAVENOUS

## 2016-04-04 MED ORDER — CLINDAMYCIN PHOSPHATE 600 MG/50ML IV SOLN
600.0000 mg | Freq: Three times a day (TID) | INTRAVENOUS | Status: AC
  Administered 2016-04-04 – 2016-04-05 (×3): 600 mg via INTRAVENOUS
  Filled 2016-04-04 (×3): qty 50

## 2016-04-04 MED ORDER — ACETAMINOPHEN 325 MG PO TABS: 650.0000 mg | ORAL_TABLET | Freq: Four times a day (QID) | ORAL | Status: DC | PRN

## 2016-04-04 MED ORDER — SENNA 8.6 MG PO TABS
1.0000 | ORAL_TABLET | Freq: Two times a day (BID) | ORAL | Status: DC
  Administered 2016-04-04 – 2016-04-05 (×2): 8.6 mg via ORAL
  Filled 2016-04-04 (×2): qty 1

## 2016-04-04 MED ORDER — HYDROCODONE-ACETAMINOPHEN 7.5-325 MG PO TABS
1.0000 | ORAL_TABLET | ORAL | Status: DC | PRN
  Administered 2016-04-04 – 2016-04-05 (×5): 2 via ORAL
  Filled 2016-04-04: qty 1
  Filled 2016-04-04 (×4): qty 2
  Filled 2016-04-04: qty 1

## 2016-04-04 MED ORDER — ACETAMINOPHEN 650 MG RE SUPP: 650.0000 mg | Freq: Four times a day (QID) | RECTAL | Status: DC | PRN

## 2016-04-04 MED ORDER — FENTANYL CITRATE (PF) 100 MCG/2ML IJ SOLN
25.0000 ug | INTRAMUSCULAR | Status: DC | PRN
  Administered 2016-04-04 (×3): 50 ug via INTRAVENOUS

## 2016-04-04 MED ORDER — GLYCOPYRROLATE 0.2 MG/ML IJ SOLN
INTRAMUSCULAR | Status: AC
  Filled 2016-04-04: qty 1

## 2016-04-04 MED ORDER — BISACODYL 10 MG RE SUPP: 10.0000 mg | Freq: Every day | RECTAL | Status: DC | PRN

## 2016-04-04 MED ORDER — OXYCODONE HCL 5 MG PO TABS
ORAL_TABLET | ORAL | Status: AC
  Administered 2016-04-04: 5 mg via ORAL
  Filled 2016-04-04: qty 1

## 2016-04-04 MED ORDER — CLINDAMYCIN PHOSPHATE 600 MG/50ML IV SOLN
INTRAVENOUS | Status: AC
  Administered 2016-04-04: 600 mg via INTRAVENOUS
  Filled 2016-04-04: qty 50

## 2016-04-04 MED ORDER — CLINDAMYCIN PHOSPHATE 600 MG/50ML IV SOLN: 600.0000 mg | INTRAVENOUS | Status: DC

## 2016-04-04 MED ORDER — FENTANYL CITRATE (PF) 100 MCG/2ML IJ SOLN
INTRAMUSCULAR | Status: DC | PRN
  Administered 2016-04-04: 50 ug via INTRAVENOUS
  Administered 2016-04-04 (×6): 25 ug via INTRAVENOUS

## 2016-04-04 MED ORDER — CEFAZOLIN SODIUM-DEXTROSE 2-4 GM/100ML-% IV SOLN: 2.0000 g | INTRAVENOUS | Status: DC

## 2016-04-04 MED ORDER — CEFAZOLIN SODIUM-DEXTROSE 2-4 GM/100ML-% IV SOLN
2.0000 g | INTRAVENOUS | Status: AC
  Administered 2016-04-04: 2 g via INTRAVENOUS

## 2016-04-04 MED ORDER — MEPERIDINE HCL 50 MG/ML IJ SOLN: 6.2500 mg | INTRAMUSCULAR | Status: DC | PRN

## 2016-04-04 MED ORDER — MELOXICAM 7.5 MG PO TABS
ORAL_TABLET | ORAL | Status: AC
  Administered 2016-04-04: 15 mg via ORAL
  Filled 2016-04-04: qty 2

## 2016-04-04 MED ORDER — GABAPENTIN 400 MG PO CAPS: 400.0000 mg | ORAL_CAPSULE | ORAL | Status: DC

## 2016-04-04 MED ORDER — ACETAMINOPHEN 10 MG/ML IV SOLN
INTRAVENOUS | Status: DC | PRN
  Administered 2016-04-04: 1000 mg via INTRAVENOUS

## 2016-04-04 MED ORDER — METOCLOPRAMIDE HCL 10 MG PO TABS: 5.0000 mg | ORAL_TABLET | Freq: Three times a day (TID) | ORAL | Status: DC | PRN

## 2016-04-04 SURGICAL SUPPLY — 59 items
BIT DRILL 100X2XQC STRL (BIT) ×1 IMPLANT
BIT DRILL 2.5X110 QC LCP DISP (BIT) ×3 IMPLANT
BIT DRILL 2.8 (BIT) ×1
BIT DRILL CANN QC 2.8X165 (BIT) ×1 IMPLANT
BIT DRILL QC 2.0X100 (BIT) ×2
BIT DRL 100X2XQC STRL (BIT) ×1
BLADE SURG MINI STRL (BLADE) ×3 IMPLANT
BNDG COHESIVE 4X5 TAN STRL (GAUZE/BANDAGES/DRESSINGS) IMPLANT
BNDG ESMARK 4X12 TAN STRL LF (GAUZE/BANDAGES/DRESSINGS) ×3 IMPLANT
CANISTER SUCT 1200ML W/VALVE (MISCELLANEOUS) ×3 IMPLANT
CHLORAPREP W/TINT 26ML (MISCELLANEOUS) ×3 IMPLANT
CUFF TOURN 18 STER (MISCELLANEOUS) IMPLANT
DRAPE FLUOR MINI C-ARM 54X84 (DRAPES) ×3 IMPLANT
DRILL BIT 2.8MM (BIT) ×2
ELECT REM PT RETURN 9FT ADLT (ELECTROSURGICAL) ×3
ELECTRODE REM PT RTRN 9FT ADLT (ELECTROSURGICAL) ×1 IMPLANT
GAUZE FLUFF 18X24 1PLY STRL (GAUZE/BANDAGES/DRESSINGS) ×6 IMPLANT
GAUZE PETRO XEROFOAM 1X8 (MISCELLANEOUS) ×3 IMPLANT
GAUZE SPONGE 4X4 12PLY STRL (GAUZE/BANDAGES/DRESSINGS) ×6 IMPLANT
GLOVE BIO SURGEON STRL SZ8 (GLOVE) ×3 IMPLANT
GLOVE INDICATOR 8.0 STRL GRN (GLOVE) ×3 IMPLANT
GLOVE SURG ORTHO 8.5 STRL (GLOVE) ×3 IMPLANT
GOWN STRL REUS W/ TWL LRG LVL3 (GOWN DISPOSABLE) ×1 IMPLANT
GOWN STRL REUS W/TWL LRG LVL3 (GOWN DISPOSABLE) ×2
GOWN STRL REUS W/TWL LRG LVL4 (GOWN DISPOSABLE) ×3 IMPLANT
K-WIRE 1.25 TRCR POINT 150 (WIRE) ×9
K-WIRE 5 THRD TROCAR 1.6X150 (WIRE) ×3
KIT RM TURNOVER STRD PROC AR (KITS) ×3 IMPLANT
KWIRE 1.25 TRCR POINT 150 (WIRE) ×3 IMPLANT
KWIRE 5 THRD TROCAR 1.6X150 (WIRE) ×1 IMPLANT
NDL SAFETY 18GX1.5 (NEEDLE) ×6 IMPLANT
NS IRRIG 500ML POUR BTL (IV SOLUTION) ×3 IMPLANT
PACK EXTREMITY ARMC (MISCELLANEOUS) ×3 IMPLANT
PAD PREP 24X41 OB/GYN DISP (PERSONAL CARE ITEMS) ×3 IMPLANT
PADDING CAST 4IN STRL (MISCELLANEOUS) ×4
PADDING CAST BLEND 4X4 STRL (MISCELLANEOUS) ×2 IMPLANT
PLATE LCP RECON 3.5 7H/98 (Plate) ×3 IMPLANT
SCREW CORTEX 2.7 24M (Screw) ×3 IMPLANT
SCREW CORTEX 3.5 14MM (Screw) ×2 IMPLANT
SCREW CORTEX 3.5 16MM (Screw) ×6 IMPLANT
SCREW CORTEX 3.5 18MM (Screw) ×2 IMPLANT
SCREW CORTEX 3.5 20MM (Screw) ×4 IMPLANT
SCREW LOCK CORT ST 3.5X14 (Screw) ×1 IMPLANT
SCREW LOCK CORT ST 3.5X16 (Screw) ×3 IMPLANT
SCREW LOCK CORT ST 3.5X18 (Screw) ×1 IMPLANT
SCREW LOCK CORT ST 3.5X20 (Screw) ×2 IMPLANT
SCREW LOCK T15 FT 24X3.5X2.9X (Screw) ×1 IMPLANT
SCREW LOCKING 3.5X24 (Screw) ×2 IMPLANT
SPLINT CAST 1 STEP 3X12 (MISCELLANEOUS) ×3 IMPLANT
SPONGE LAP 18X18 5 PK (GAUZE/BANDAGES/DRESSINGS) ×3 IMPLANT
STAPLER SKIN PROX 35W (STAPLE) ×3 IMPLANT
STOCKINETTE BIAS CUT 4 980044 (GAUZE/BANDAGES/DRESSINGS) ×3 IMPLANT
STOCKINETTE STRL 4IN 9604848 (GAUZE/BANDAGES/DRESSINGS) ×3 IMPLANT
SUT ETHILON 4-0 (SUTURE) ×2
SUT ETHILON 4-0 FS2 18XMFL BLK (SUTURE) ×1
SUT ETHILON 5-0 FS-2 18 BLK (SUTURE) ×3 IMPLANT
SUT VIC AB 3-0 SH 27 (SUTURE) ×2
SUT VIC AB 3-0 SH 27X BRD (SUTURE) ×1 IMPLANT
SUTURE ETHLN 4-0 FS2 18XMF BLK (SUTURE) ×1 IMPLANT

## 2016-04-04 NOTE — Anesthesia Postprocedure Evaluation (Signed)
Anesthesia Post Note  Patient: Christy Williams  Procedure(s) Performed: Procedure(s) (LRB): OPEN REDUCTION INTERNAL FIXATION (ORIF) WRIST WITH RADIAL STYLOIDECTOMY (Right) OPEN REDUCTION INTERNAL FIXATION (ORIF) DISTAL RADIAL FRACTURE (Right) CARPAL TUNNEL RELEASE (Right)  Patient location during evaluation: PACU Anesthesia Type: General Level of consciousness: awake and alert and oriented Pain management: pain level controlled Vital Signs Assessment: post-procedure vital signs reviewed and stable Respiratory status: spontaneous breathing, nonlabored ventilation and respiratory function stable Cardiovascular status: blood pressure returned to baseline and stable Postop Assessment: no signs of nausea or vomiting Anesthetic complications: no     Last Vitals:  Vitals:   04/04/16 1325 04/04/16 1335  BP:  (!) 141/75  Pulse: 71 87  Resp: 12 15  Temp:  37.1 C    Last Pain:  Vitals:   04/04/16 1335  TempSrc:   PainSc: Asleep                 Quincy Prisco

## 2016-04-04 NOTE — Anesthesia Procedure Notes (Signed)
Procedure Name: LMA Insertion Date/Time: 04/04/2016 10:33 AM Performed by: Irving BurtonBACHICH, Elease Swarm Pre-anesthesia Checklist: Patient identified, Emergency Drugs available, Suction available and Patient being monitored Patient Re-evaluated:Patient Re-evaluated prior to inductionOxygen Delivery Method: Circle system utilized Preoxygenation: Pre-oxygenation with 100% oxygen Intubation Type: IV induction Ventilation: Mask ventilation without difficulty LMA: LMA inserted LMA Size: 3.5 Number of attempts: 1 Placement Confirmation: positive ETCO2 and breath sounds checked- equal and bilateral Tube secured with: Tape

## 2016-04-04 NOTE — Op Note (Signed)
04/04/2016  12:46 PM  PATIENT:  Christy Williams    PRE-OPERATIVE DIAGNOSIS:  Displaced Galeazzi fracture dislocation right forearm    POST-OPERATIVE DIAGNOSIS:  Same  PROCEDURE:    OPEN REDUCTION INTERNAL FIXATION (ORIF) DISTAL RADIAL FRACTURE WITH PINNING OF RADIOULNAR JOINT   SURGEON:  Valinda Hoar, MD  TOURNIQUET TIME: 101  MIN  ANESTHESIA:   General  PREOPERATIVE INDICATIONS:  Christy Williams is a  41 y.o. female with a diagnosis of  Displaced Galeazzi fracture dislocation right forearm.     The risks benefits and alternatives were discussed with the patient preoperatively including but not limited to the risks of infection, bleeding, nerve injury, malunion, nonunion, wrist stiffness, persistent wrist pain, osteoarthritis and the need for further surgery. Medical risks include but are not limited to DVT and pulmonary embolism, myocardial infarction, stroke, pneumonia, respiratory failure and death. Patient  understood these risks and wished to proceed.  I discussed the fact with the patient that this fracture was now 68-1/2   weeks old due to her failing 3 previous operative dates.  This may be procedure much more difficult due to shortening and scarring and no guarantee of success fracture reduction and healing was given.  In addition, the distal radial ulnar joint was severely disrupted and I advised that she may have difficulty with this later.  With this understanding, she wished to proceed.  OPERATIVE IMPLANTS: 7-hole 3.5 locking plate, one compression screw, 2 K wires  OPERATIVE FINDINGS: Displaced fracture dislocation, right distal radius and radial ulnar joint with early healing  OPERATIVE PROCEDURE: Patient was seen in the preoperative area. I marked the operative hand with the word yes and my initials according the hospital's correct site of surgery protocol. Patient was then brought to the operating roomand was placed supine on the operative table and underwent  general anesthesia with an LMA.   The operative arm was prepped and draped in a sterile fashion. A timeout performed to verify the patient's name, date of birth, medical record number, correct site of surgery correct procedure to be performed. The timeout was also used a timeout to verify patient received antibiotics and appropriate instruments, implants and radiographs studies were available in the room. Once all in attendance were in agreement case began.   Patient then had the operative extremity exsanguinated with an Esmarch. The tourniquet was placed on the upper extremity and inflated 250 mm.  A manual reduction of the fracture was performed. The fracture reduction was confirmed on FluoroScan imaging.  A linear incision was then made over the FCR tendon. The subcutaneous tissue was carefully dissected using Metzenbaum scissor and Adson pickup. Retractors were used to protect the radial artery and median nerve. The pronator quadratus was identified and incised and elevated off the volar surface of the distal radius.  Soft tissue was stripped off the proximal shaft of the radius and the volar aspect of the distal fragment.  The periosteal elevator.  Dissection was carried out around both bone circumferentially to free them up from the early  scar tissue and early healing noted taken place over the last 3-1/2 weeks since the fracture.  Reduction forceps were applied to the bones and gradual traction was applied.  Fingertrap traction was used.  A periosteal elevator was used to lever the distal fragment more distally so that it was able to be reduced on the end of the proximal fragment.  Full length was obtained and rotation was established.  The fracture was provisionally pinned  with 2 K wires.  Fluoroscopy showed satisfactory position overall of the fracture site and the distal radial ulnar joint.  A 2.7 mm lag screw was drilled from  distal to proximal across the fracture to provide compression.  A 7-hole  3.5 small fragment locking plate was then bent to the contour of the bone and applied.  I brought the tip right out to the volar aspect of the radius just short of the joint itself.  The plate was fixed with cancellus screw proximally and fluoroscopy showed satisfactory length and position.  2 cortical and one locking screw were then put in place distally and a total of 4 cortical screws placed proximally.  This fixed the fracture securely.  Rotation showed that the distal radial ulnar joint was fairly congruent, but there was slight instability.  I then supinated the forearm and placed 2 K wires across the ulna and through the radius to stabilize the radial ulnar joint.  These were bent, cut and buried.  Fluoroscopy showed all hardware to begin in good position and the radial ulnar joint reduced.  The ulnar styloid was out of the joint.  The fracture was aligned.  Visually, there was good compression of the proximal and distal fragment  The position and length of all screws were confirmed on AP and lateral FluoroScan imaging.  the plate to fill the remaining holes. The wound was then copiously irrigated. Final FluoroScan imaging of the construct were taken. The wound again was copiously irrigated. The soft tissue was then placed carefully over the plate. The tissues were infiltrated with 1/2% marcaine.  The skin was closed with staples. Xeroform and a dry sterile dressing were applied along with a volar splint. I was scrubbed and present for the entire case and all sharp and instrument counts were correct at the conclusion the case. The patient tolerated this procedure well and was awakened and taken to the recovery room in good condition.   Deeann SaintHoward Randall Colden, MD

## 2016-04-04 NOTE — Anesthesia Post-op Follow-up Note (Cosign Needed)
Anesthesia QCDR form completed.        

## 2016-04-04 NOTE — Anesthesia Preprocedure Evaluation (Signed)
Anesthesia Evaluation  Patient identified by MRN, date of birth, ID band Patient awake    Reviewed: Allergy & Precautions, NPO status , Patient's Chart, lab work & pertinent test results  History of Anesthesia Complications Negative for: history of anesthetic complications  Airway Mallampati: II  TM Distance: >3 FB Neck ROM: Full    Dental  (+)    Pulmonary neg sleep apnea, neg COPD, Current Smoker,    breath sounds clear to auscultation- rhonchi (-) wheezing      Cardiovascular Exercise Tolerance: Good (-) hypertension(-) CAD and (-) Past MI  Rhythm:Regular Rate:Normal - Systolic murmurs and - Diastolic murmurs    Neuro/Psych negative neurological ROS  negative psych ROS   GI/Hepatic negative GI ROS, Neg liver ROS,   Endo/Other  negative endocrine ROSneg diabetes  Renal/GU negative Renal ROS     Musculoskeletal negative musculoskeletal ROS (+)   Abdominal (+) + obese,   Peds  Hematology  (+) anemia ,   Anesthesia Other Findings    Reproductive/Obstetrics                             Anesthesia Physical Anesthesia Plan  ASA: II  Anesthesia Plan: General   Post-op Pain Management:    Induction: Intravenous  Airway Management Planned: LMA  Additional Equipment:   Intra-op Plan:   Post-operative Plan:   Informed Consent: I have reviewed the patients History and Physical, chart, labs and discussed the procedure including the risks, benefits and alternatives for the proposed anesthesia with the patient or authorized representative who has indicated his/her understanding and acceptance.   Dental advisory given  Plan Discussed with: CRNA and Anesthesiologist  Anesthesia Plan Comments:         Anesthesia Quick Evaluation

## 2016-04-04 NOTE — Transfer of Care (Signed)
Immediate Anesthesia Transfer of Care Note  Patient: Christy Williams  Procedure(s) Performed: Procedure(s) with comments: OPEN REDUCTION INTERNAL FIXATION (ORIF) WRIST WITH RADIAL STYLOIDECTOMY (Right) OPEN REDUCTION INTERNAL FIXATION (ORIF) DISTAL RADIAL FRACTURE (Right) CARPAL TUNNEL RELEASE (Right) - Possible Carpal Tunnel Release   Patient Location: PACU  Anesthesia Type:General  Level of Consciousness: awake, alert  and oriented  Airway & Oxygen Therapy: Patient connected to face mask oxygen  Post-op Assessment: Post -op Vital signs reviewed and stable  Post vital signs: stable  Last Vitals:  Vitals:   04/04/16 0805 04/04/16 1250  BP: 112/77 96/64  Pulse: 98 92  Resp: 20 16  Temp: 37 C 36.8 C    Last Pain:  Vitals:   04/04/16 1250  TempSrc: Temporal  PainSc:          Complications: No apparent anesthesia complications

## 2016-04-04 NOTE — H&P (Signed)
THE PATIENT WAS SEEN PRIOR TO SURGERY TODAY.  HISTORY, ALLERGIES, HOME MEDICATIONS AND OPERATIVE PROCEDURE WERE REVIEWED. RISKS AND BENEFITS OF SURGERY DISCUSSED WITH PATIENT AGAIN.  NO CHANGES FROM INITIAL HISTORY AND PHYSICAL NOTED.   I EXPLAINED TO THE PATIENT THAT THIS WOULD BE A VERY DIFFICULT PROCEDURE SINCE IT WAS 3 1/2 WEEKS SINCE INJURY AND SHORTENING HAD OCCURRED AND SHE HAD MISSED 3 PRIOR SURGERY DATES BUT THAT WE WOULD DO THE BEST WE COULD TO RESTORE THIS TO FUNCTION.  VOLAR AND ULNAR INCISIONS MAY BE NEEDED.

## 2016-04-05 ENCOUNTER — Encounter: Payer: Self-pay | Admitting: Specialist

## 2016-04-05 DIAGNOSIS — S52371A Galeazzi's fracture of right radius, initial encounter for closed fracture: Secondary | ICD-10-CM | POA: Diagnosis not present

## 2016-04-05 MED ORDER — IBUPROFEN 800 MG PO TABS: 800.0000 mg | ORAL_TABLET | Freq: Three times a day (TID) | ORAL | 3 refills | Status: DC | PRN

## 2016-04-05 MED ORDER — GABAPENTIN 400 MG PO CAPS: 400.0000 mg | ORAL_CAPSULE | Freq: Three times a day (TID) | ORAL | 3 refills | Status: DC

## 2016-04-05 MED ORDER — MELOXICAM 15 MG PO TABS: 15.0000 mg | ORAL_TABLET | Freq: Every day | ORAL | 3 refills | Status: DC

## 2016-04-05 NOTE — Progress Notes (Signed)
Subjective: 1 Day Post-Op Procedure(s) (LRB): OPEN REDUCTION INTERNAL FIXATION (ORIF) WRIST WITH RADIAL STYLOIDECTOMY (Right) OPEN REDUCTION INTERNAL FIXATION (ORIF) DISTAL RADIAL FRACTURE (Right) CARPAL TUNNEL RELEASE (Right)    Patient reports pain as mild.  Objective:   VITALS:   Vitals:   04/05/16 0351 04/05/16 0805  BP: (!) 123/53 116/84  Pulse: 71 69  Resp: 18 18  Temp: 98.2 F (36.8 C) 98.4 F (36.9 C)    Neurologically intact ABD soft Neurovascular intact Sensation intact distally Intact pulses distally dressing dry  LABS No results for input(s): HGB, HCT, WBC, PLT in the last 72 hours.  No results for input(s): NA, K, BUN, CREATININE, GLUCOSE in the last 72 hours.  No results for input(s): LABPT, INR in the last 72 hours.   Assessment/Plan: 1 Day Post-Op Procedure(s) (LRB): OPEN REDUCTION INTERNAL FIXATION (ORIF) WRIST WITH RADIAL STYLOIDECTOMY (Right) OPEN REDUCTION INTERNAL FIXATION (ORIF) DISTAL RADIAL FRACTURE (Right) CARPAL TUNNEL RELEASE (Right)   Advance diet Up with therapy Discharge home with home health

## 2016-04-05 NOTE — Discharge Summary (Signed)
Physician Discharge Summary  Patient ID: Christy Williams MRN: [id number] DOB/AGE: 10-30-1975 41 y.o. y.o.  Admit date: 04/04/2016 Discharge date: 04/05/2016  Admission Diagnoses: Displaced Galeazzi fracture dislocation right arm  Discharge Diagnoses: Same Active Problems:   Right wrist fracture, closed, initial encounter   Discharged Condition: good  Hospital Course: Successful ORIF right forearm fracture on 04/04/16.  CSM and finger motion good today.  [facility] jail today and RTC tromorrow.    Consults: None  Significant Diagnostic Studies:    Treatments: antibiotics: Ancef  Discharge Exam: Blood pressure 116/84, pulse 69, temperature 98.4 F (36.9 C), temperature source Oral, resp. rate 18, height 5\' 4"  (1.626 m), weight 81.6 kg (180 lb), SpO2 100 %. Dressing dry.  CSM good  Disposition: 01-Home or Self Care  Discharge Instructions    Call MD for:  persistant nausea and vomiting    Complete by:  As directed    Call MD for:  redness, tenderness, or signs of infection (pain, swelling, redness, odor or green/yellow discharge around incision site)    Complete by:  As directed    Call MD for:  severe uncontrolled pain    Complete by:  As directed    Call MD for:  temperature >100.4    Complete by:  As directed    Diet - low sodium heart healthy    Complete by:  As directed    Discharge instructions    Complete by:  As directed    Elevate right arm at all times Move fingers aggressively RTC tomorrow   Increase activity slowly    Complete by:  As directed      Allergies as of 04/05/2016      Reactions   Penicillins Itching      Medication List    STOP taking these medications   acetaminophen 500 MG tablet Commonly known as:  TYLENOL   aspirin 325 MG tablet   diphenhydrAMINE 25 MG tablet Commonly known as:  BENADRYL   HYDROcodone-acetaminophen 5-325 MG tablet Commonly known as:  NORCO     TAKE these medications   gabapentin 400 MG capsule Commonly known  as:  NEURONTIN Take 1 capsule (400 mg total) by mouth 3 (three) times daily.   ibuprofen 800 MG tablet Commonly known as:  ADVIL,MOTRIN Take 1 tablet (800 mg total) by mouth every 8 (eight) hours as needed.   meloxicam 15 MG tablet Commonly known as:  MOBIC Take 1 tablet (15 mg total) by mouth daily.      Follow-up Information    Valinda HoarMILLER,Naliah Eddington E, MD Follow up on 04/06/2016.   Specialty:  Specialist Why:  @ 9:45 am Contact information: [address] [place] [address] [phone]           Signed: Valinda HoarMILLER,Orva Gwaltney E 04/05/2016, 1:09 PM

## 2016-04-05 NOTE — Plan of Care (Signed)
Problem: Safety: Goal: Ability to remain free from injury will improve Outcome: Progressing Able to ask for assistance when ambulating.  Problem: Pain Managment: Goal: General experience of comfort will improve Outcome: Progressing Pain control with oral pain medication  Problem: Activity: Goal: Risk for activity intolerance will decrease Outcome: Progressing Ambulating in room.  Problem: Fluid Volume: Goal: Ability to maintain a balanced intake and output will improve Outcome: Progressing Eating and drinking without difficulty.

## 2016-04-05 NOTE — Evaluation (Signed)
Physical Therapy Evaluation Patient Details Name: Christy Williams MRN: [id number] DOB: January 01, 1976 Today's Date: 04/05/2016   History of Present Illness  Pt is a 41 y/o F s/p ORIF R distal radial fracture with pinning of radioulnar joint.  Clinical Impression  Patient is s/p above surgery.  She was Independent PTA and does not show any signs of instability with mobility upon PT evaluation.  Pt educated to keep RUE elevated and to wiggle fingers throughout day to assist with reduced swelling.  As pt independent with all mobility, no skilled PT need identified. PT will sign off at this time.   Follow Up Recommendations No PT follow up    Equipment Recommendations  None recommended by PT    Recommendations for Other Services       Precautions / Restrictions Precautions Precautions: Fall;Other (comment) Precaution Comments: Pt's L leg cuffed to bed with guard in room Required Braces or Orthoses: Other Brace/Splint Other Brace/Splint: R wrist in cast Restrictions Weight Bearing Restrictions: Yes RUE Weight Bearing: Non weight bearing      Mobility  Bed Mobility Overal bed mobility: Modified Independent             General bed mobility comments: Increased time as bed rail down and HOB flat to simulate home environment.    Transfers Overall transfer level: Independent Equipment used: None             General transfer comment: No instability noted.  Pt performs independently.  Ambulation/Gait Ambulation/Gait assistance: Independent Ambulation Distance (Feet): 90 Feet Assistive device: None Gait Pattern/deviations: WFL(Within Functional Limits)   Gait velocity interpretation: at or above normal speed for age/gender General Gait Details: No instability or gait abnormalities appreciated.  Pt independent.  Stairs Stairs:  (see general notes, unable to assess)          Wheelchair Mobility    Modified Rankin (Stroke Patients Only)       Balance Overall  balance assessment: Independent                                           Pertinent Vitals/Pain Pain Assessment: Faces Faces Pain Scale: Hurts even more Pain Location: R wrist when in dependent position and R knee "when I lean it on something" and R knee with flexion Pain Descriptors / Indicators: Aching;Grimacing;Guarding Pain Intervention(s): Limited activity within patient's tolerance;Monitored during session    Home Living Family/patient expects to be discharged to:: [facility] Living Arrangements: Spouse/significant other Available Help at Discharge: Family;Available PRN/intermittently Type of Home: Mobile home Home Access: Stairs to enter Entrance Stairs-Rails: Left Entrance Stairs-Number of Steps: 2 Home Layout: One level Home Equipment: None Additional Comments: Pt currently in prison.  The above information reflects her home layout and environment.    Prior Function Level of Independence: Independent               Hand Dominance   Dominant Hand: Right    Extremity/Trunk Assessment   Upper Extremity Assessment Upper Extremity Assessment:  (s/p surgery in cast)    Lower Extremity Assessment Lower Extremity Assessment: RLE deficits/detail RLE Deficits / Details: R knee flexion strength not tested as pt reports pain with AROM into R knee flexion.  No TTP.  Strength otherwise WNL.    Cervical / Trunk Assessment Cervical / Trunk Assessment: Normal  Communication   Communication: No difficulties  Cognition Arousal/Alertness: Awake/alert Behavior During  Therapy: WFL for tasks assessed/performed;Anxious Overall Cognitive Status: Within Functional Limits for tasks assessed                      General Comments General comments (skin integrity, edema, etc.): When this PT opened the door to the stairs the pt became very anxious and emotional saying, "I feel like I'm going to fall" and asked to return to room to sit.  Pt slightly  tearful and saying, "I don't know why I got so anxious about the steps".  Pt could be afraid of heights?  Pt was independently navigating steps PTA and will have assist if needed at d/c to navigate steps.  Unable to assess at this time but no concerns moving forward.     Exercises Other Exercises Other Exercises: Pt encouraged to wiggle R fingers to assist with swelling reduction which pt performs during this PT session.   Assessment/Plan    PT Assessment Patent does not need any further PT services  PT Problem List         PT Treatment Interventions      PT Goals (Current goals can be found in the Care Plan section)  Acute Rehab PT Goals Patient Stated Goal: decreased pain PT Goal Formulation: All assessment and education complete, DC therapy    Frequency     Barriers to discharge        Co-evaluation               End of Session   Activity Tolerance: Patient tolerated treatment well Patient left: in bed;with call bell/phone within reach;Other (comment) (with guard in room, R wrist elevated, pt cuffed to bed) Nurse Communication: Mobility status;Other (comment);Weight bearing status (anxiety about steps) PT Visit Diagnosis: Pain Pain - Right/Left: Right Pain - part of body: Hand    Functional Assessment Tool Used: Clinical judgement;AM-PAC 6 Clicks Basic Mobility Functional Limitation: Mobility: Walking and moving around Mobility: Walking and Moving Around Current Status (W0981): 0 percent impaired, limited or restricted Mobility: Walking and Moving Around Goal Status (X9147): 0 percent impaired, limited or restricted Mobility: Walking and Moving Around Discharge Status [phone]): 0 percent impaired, limited or restricted    Time: 1057-1109 PT Time Calculation (min) (ACUTE ONLY): 12 min   Charges:   PT Evaluation $PT Eval Low Complexity: 1 Procedure     PT G Codes:   PT G-Codes **NOT FOR INPATIENT CLASS** Functional Assessment Tool Used: Clinical  judgement;AM-PAC 6 Clicks Basic Mobility Functional Limitation: Mobility: Walking and moving around Mobility: Walking and Moving Around Current Status (O1308): 0 percent impaired, limited or restricted Mobility: Walking and Moving Around Goal Status (M5784): 0 percent impaired, limited or restricted Mobility: Walking and Moving Around Discharge Status (O9629): 0 percent impaired, limited or restricted     Encarnacion Chu PT, DPT 04/05/2016, 11:29 AM

## 2016-10-18 ENCOUNTER — Encounter: Payer: Self-pay | Admitting: Specialist

## 2017-07-25 ENCOUNTER — Ambulatory Visit: Payer: Self-pay | Admitting: Surgery

## 2017-07-30 ENCOUNTER — Telehealth: Payer: Self-pay | Admitting: Surgery

## 2017-07-30 ENCOUNTER — Encounter: Payer: Self-pay | Admitting: Surgery

## 2017-07-30 NOTE — Telephone Encounter (Signed)
Unable to leave a message for the patient to call the office. Patient no showed appointment on 07/25/17 with Dr. Aleen CampiPiscoya. I have also mailed a letter for the patient to contact our office.

## 2017-08-13 ENCOUNTER — Encounter: Payer: Self-pay | Admitting: Emergency Medicine

## 2017-08-13 ENCOUNTER — Other Ambulatory Visit: Payer: Self-pay

## 2017-08-13 ENCOUNTER — Emergency Department
Admission: EM | Admit: 2017-08-13 | Discharge: 2017-08-13 | Disposition: A | Discharge status: Home / Self Care | Payer: Medicaid Other | Attending: Emergency Medicine | Admitting: Emergency Medicine

## 2017-08-13 DIAGNOSIS — N12 Tubulo-interstitial nephritis, not specified as acute or chronic: Secondary | ICD-10-CM

## 2017-08-13 DIAGNOSIS — A5901 Trichomonal vulvovaginitis: Secondary | ICD-10-CM | POA: Insufficient documentation

## 2017-08-13 DIAGNOSIS — N390 Urinary tract infection, site not specified: Secondary | ICD-10-CM | POA: Insufficient documentation

## 2017-08-13 DIAGNOSIS — N760 Acute vaginitis: Secondary | ICD-10-CM | POA: Insufficient documentation

## 2017-08-13 DIAGNOSIS — N72 Inflammatory disease of cervix uteri: Secondary | ICD-10-CM | POA: Insufficient documentation

## 2017-08-13 DIAGNOSIS — F17210 Nicotine dependence, cigarettes, uncomplicated: Secondary | ICD-10-CM | POA: Insufficient documentation

## 2017-08-13 DIAGNOSIS — A599 Trichomoniasis, unspecified: Secondary | ICD-10-CM

## 2017-08-13 DIAGNOSIS — M545 Low back pain: Secondary | ICD-10-CM | POA: Diagnosis present

## 2017-08-13 DIAGNOSIS — B9689 Other specified bacterial agents as the cause of diseases classified elsewhere: Secondary | ICD-10-CM | POA: Diagnosis not present

## 2017-08-13 LAB — URINALYSIS, COMPLETE (UACMP) WITH MICROSCOPIC
BILIRUBIN URINE: NEGATIVE
GLUCOSE, UA: NEGATIVE mg/dL
HGB URINE DIPSTICK: NEGATIVE
Ketones, ur: NEGATIVE mg/dL
NITRITE: NEGATIVE
PH: 5 (ref 5.0–8.0)
Protein, ur: NEGATIVE mg/dL
SPECIFIC GRAVITY, URINE: 1.026 (ref 1.005–1.030)

## 2017-08-13 LAB — COMPREHENSIVE METABOLIC PANEL
ALK PHOS: 50 U/L (ref 38–126)
ALT: 20 U/L (ref 0–44)
AST: 18 U/L (ref 15–41)
Albumin: 4.1 g/dL (ref 3.5–5.0)
Anion gap: 6 (ref 5–15)
BILIRUBIN TOTAL: 0.3 mg/dL (ref 0.3–1.2)
BUN: 17 mg/dL (ref 6–20)
CALCIUM: 9.5 mg/dL (ref 8.9–10.3)
CO2: 22 mmol/L (ref 22–32)
CREATININE: 0.6 mg/dL (ref 0.44–1.00)
Chloride: 109 mmol/L (ref 98–111)
GFR calc Af Amer: >60 mL/min (ref >60)
Glucose, Bld: 92 mg/dL (ref 70–99)
Potassium: 4.1 mmol/L (ref 3.5–5.1)
Sodium: 137 mmol/L (ref 135–145)
TOTAL PROTEIN: 7.8 g/dL (ref 6.5–8.1)

## 2017-08-13 LAB — WET PREP, GENITAL
SPERM: NONE SEEN
Yeast Wet Prep HPF POC: NONE SEEN

## 2017-08-13 LAB — CBC
HCT: 33.3 % — ABNORMAL LOW (ref 35.0–47.0)
Hemoglobin: 10.7 g/dL — ABNORMAL LOW (ref 12.0–16.0)
MCH: 24.5 pg — ABNORMAL LOW (ref 26.0–34.0)
MCHC: 32 g/dL (ref 32.0–36.0)
MCV: 76.6 fL — ABNORMAL LOW (ref 80.0–100.0)
PLATELETS: 465 10*3/uL — AB (ref 150–440)
RBC: 4.35 MIL/uL (ref 3.80–5.20)
RDW: 20.1 % — AB (ref 11.5–14.5)
WBC: 8.8 10*3/uL (ref 3.6–11.0)

## 2017-08-13 LAB — CHLAMYDIA/NGC RT PCR (ARMC ONLY)
Chlamydia Tr: NOT DETECTED
N GONORRHOEAE: NOT DETECTED

## 2017-08-13 LAB — POCT PREGNANCY, URINE: Preg Test, Ur: NEGATIVE

## 2017-08-13 LAB — LIPASE, BLOOD: Lipase: 41 U/L (ref 11–51)

## 2017-08-13 MED ORDER — CEFTRIAXONE SODIUM 250 MG IJ SOLR
250.0000 mg | Freq: Once | INTRAMUSCULAR | Status: AC
  Administered 2017-08-13: 250 mg via INTRAMUSCULAR
  Filled 2017-08-13: qty 250

## 2017-08-13 MED ORDER — METRONIDAZOLE 500 MG PO TABS: 500.0000 mg | ORAL_TABLET | Freq: Two times a day (BID) | ORAL | 0 refills | Status: AC

## 2017-08-13 MED ORDER — METRONIDAZOLE 500 MG PO TABS
500.0000 mg | ORAL_TABLET | Freq: Once | ORAL | Status: AC
  Administered 2017-08-13: 500 mg via ORAL
  Filled 2017-08-13: qty 1

## 2017-08-13 MED ORDER — IBUPROFEN 400 MG PO TABS
600.0000 mg | ORAL_TABLET | Freq: Once | ORAL | Status: AC
  Administered 2017-08-13: 600 mg via ORAL
  Filled 2017-08-13: qty 2

## 2017-08-13 MED ORDER — AZITHROMYCIN 500 MG PO TABS
1000.0000 mg | ORAL_TABLET | Freq: Once | ORAL | Status: AC
  Administered 2017-08-13: 1000 mg via ORAL
  Filled 2017-08-13: qty 2

## 2017-08-13 MED ORDER — CEPHALEXIN 500 MG PO CAPS: 500.0000 mg | ORAL_CAPSULE | Freq: Three times a day (TID) | ORAL | 0 refills | Status: AC

## 2017-08-13 NOTE — ED Provider Notes (Signed)
[facility] Emergency Department Provider Note ____________________________________________   First MD Initiated Contact with Patient 08/13/17 1657     (approximate)  I have reviewed the triage vital signs and the nursing notes.   HISTORY  Chief Complaint Abdominal Pain and Back Pain  HPI Christy Williams is a 42 y.o. female with a history of anemia as well as UTI who was presented to the emergency department with left sided lumbar back pain which she says is radiating into the anterior of her bilateral lower extremities.  Says that she woke up with his pain 2 days ago.  Denies any heavy lifting.  Denies any bending or strenuous activity that could have caused the pain.  Says the pain is moderate and she has not tried any medication at home for this.  Says that she has a baseline vaginal discharge.  Also says that she has had increased urinary frequency but without any burning with urination.  Denies any diarrhea.  Does not report any loss of bowel or bladder continence.  Past Medical History:  Diagnosis Date  . Anemia 2000    Patient Active Problem List   Diagnosis Date Noted  . Right wrist fracture, closed, initial encounter 04/04/2016    Past Surgical History:  Procedure Laterality Date  . CARPAL TUNNEL RELEASE Right 04/04/2016   Procedure: CARPAL TUNNEL RELEASE;  Surgeon: Deeann SaintMiller, Howard, MD;  Location: [facility] ORS;  Service: Orthopedics;  Laterality: Right;  Possible Carpal Tunnel Release   . CESAREAN SECTION  2008  . OPEN REDUCTION INTERNAL FIXATION (ORIF) DISTAL RADIAL FRACTURE Right 04/04/2016   Procedure: OPEN REDUCTION INTERNAL FIXATION (ORIF) DISTAL RADIAL FRACTURE;  Surgeon: Deeann SaintMiller, Howard, MD;  Location: [facility] ORS;  Service: Orthopedics;  Laterality: Right;  . OPEN REDUCTION INTERNAL FIXATION (ORIF) WRIST WITH RADIAL STYLOIDECTOMY Right 04/04/2016   Procedure: OPEN REDUCTION INTERNAL FIXATION (ORIF) WRIST WITH RADIAL STYLOIDECTOMY;  Surgeon:  Deeann SaintMiller, Howard, MD;  Location: [facility] ORS;  Service: Orthopedics;  Laterality: Right;    Prior to Admission medications   Medication Sig Start Date End Date Taking? Authorizing Provider  gabapentin (NEURONTIN) 400 MG capsule Take 1 capsule (400 mg total) by mouth 3 (three) times daily. 04/05/16   Deeann SaintMiller, Howard, MD  ibuprofen (ADVIL,MOTRIN) 800 MG tablet Take 1 tablet (800 mg total) by mouth every 8 (eight) hours as needed. 04/05/16   Deeann SaintMiller, Howard, MD  meloxicam (MOBIC) 15 MG tablet Take 1 tablet (15 mg total) by mouth daily. 04/05/16   Deeann SaintMiller, Howard, MD    Allergies Penicillins  Family History  Problem Relation Age of Onset  . Breast cancer Maternal Grandmother     Social History Social History   Tobacco Use  . Smoking status: Current Every Day Smoker    Packs/day: 0.25    Years: 10.00    Pack years: 2.50  . Smokeless tobacco: Never Used  Substance Use Topics  . Alcohol use: Yes    Alcohol/week: 1.2 oz    Types: 2 Cans of beer per week  . Drug use: Not on file    Review of Systems  Constitutional: No fever/chills Eyes: No visual changes. ENT: No sore throat. Cardiovascular: Denies chest pain. Respiratory: Denies shortness of breath. Gastrointestinal: No abdominal pain.  No nausea, no vomiting.  No diarrhea.  No constipation. Genitourinary: As above Musculoskeletal: As above Skin: Negative for rash. Neurological: Negative for headaches, focal weakness or numbness. ____________________________________________   PHYSICAL EXAM:  VITAL SIGNS: ED Triage Vitals  Enc Vitals Group  BP 08/13/17 1644 122/71     Pulse Rate 08/13/17 1644 96     Resp 08/13/17 1644 20     Temp 08/13/17 1644 99.3 F (37.4 C)     Temp Source 08/13/17 1644 Oral     SpO2 08/13/17 1644 98 %     Weight 08/13/17 1644 189 lb (85.7 kg)     Height 08/13/17 1644 5\' 4"  (1.626 m)     Head Circumference --      Peak Flow --      Pain Score 08/13/17 1648 7     Pain Loc --      Pain Edu? --       Excl. in GC? --     Constitutional: Alert and oriented. Well appearing and in no acute distress. Eyes: Conjunctivae are normal.  Head: Atraumatic. Nose: No congestion/rhinnorhea. Mouth/Throat: Mucous membranes are moist.  Neck: No stridor.   Cardiovascular: Normal rate, regular rhythm. Grossly normal heart sounds.   Respiratory: Normal respiratory effort.  No retractions. Lungs CTAB. Gastrointestinal: Soft and nontender. No distention.  Mild left-sided CVA tenderness to palpation Genitourinary: External exam is grossly normal without any lesions noted.  Mild, white vaginal discharge on speculum exam.  Bimanual exam without CMT.  No uterine or adnexal tenderness nor masses. Musculoskeletal: No lower extremity tenderness nor edema.  No joint effusions.  Mild to moderate left lumbar tenderness to palpation.  Negative straight leg raise bilaterally.  No saddle anesthesia. Neurologic:  Normal speech and language. No gross focal neurologic deficits are appreciated. Skin:  Skin is warm, dry and intact. No rash noted. Psychiatric: Mood and affect are normal. Speech and behavior are normal.  ____________________________________________   LABS (all labs ordered are listed, but only abnormal results are displayed)  Labs Reviewed  WET PREP, GENITAL - Abnormal; Notable for the following components:      Result Value   Trich, Wet Prep PRESENT (*)    Clue Cells Wet Prep HPF POC PRESENT (*)    WBC, Wet Prep HPF POC TOO NUMEROUS TO COUNT (*)    All other components within normal limits  CBC - Abnormal; Notable for the following components:   Hemoglobin 10.7 (*)    HCT 33.3 (*)    MCV 76.6 (*)    MCH 24.5 (*)    RDW 20.1 (*)    Platelets 465 (*)    All other components within normal limits  URINALYSIS, COMPLETE (UACMP) WITH MICROSCOPIC - Abnormal; Notable for the following components:   Color, Urine YELLOW (*)    APPearance CLOUDY (*)    Leukocytes, UA LARGE (*)    Bacteria, UA RARE (*)     All other components within normal limits  CHLAMYDIA/NGC RT PCR ([facility] ONLY)  URINE CULTURE  LIPASE, BLOOD  COMPREHENSIVE METABOLIC PANEL  POC URINE PREG, ED  POCT PREGNANCY, URINE   ____________________________________________  EKG   ____________________________________________  RADIOLOGY   ____________________________________________   PROCEDURES  Procedure(s) performed:   Procedures  Critical Care performed:   ____________________________________________   INITIAL IMPRESSION / ASSESSMENT AND PLAN / ED COURSE  Pertinent labs & imaging results that were available during my care of the patient were reviewed by me and considered in my medical decision making (see chart for details).  DDX: Pyelonephritis, UTI, cervicitis, lumbar strain, radiculopathy As part of my medical decision making, I reviewed the following data within the electronic MEDICAL RECORD NUMBER Notes from prior ED visits  ----------------------------------------- 6:29 PM on 08/13/2017 -----------------------------------------  Patient found to be positive for trichomonas.  I will treat the patient for cervicitis as well as a UTI.  She is aware of the diagnosis as well as the need to tell her partner so that the partner can be tested and treated as well.  She knows that she was follow-up with the health department for further STD treatment and was not have sex with anyone until she is cleared after further testing and possible treatment.  She understands that her partner also must not be sexually active until he is tested and treated and cleared by healthcare professional.  ____________________________________________   FINAL CLINICAL IMPRESSION(S) / ED DIAGNOSES  Cervicitis.  Trichomonas.  Bacterial vaginosis.  UTI.    NEW MEDICATIONS STARTED DURING THIS VISIT:  New Prescriptions   No medications on file     Note:  This document was prepared using Dragon voice recognition software and  may include unintentional dictation errors.     Myrna Blazer, MD 08/13/17 [phone]

## 2017-08-13 NOTE — ED Triage Notes (Signed)
Pt reports that she developed 2 days ago. She states that it was hard for her to get out of bed because of her back pain. She also wants to make sure that her Gall Bladder is not acting up because she has had a stone before. It was a year ago. Pt is eating and drinking in triage.

## 2017-08-14 LAB — URINE CULTURE: Culture: NO GROWTH

## 2017-10-08 ENCOUNTER — Encounter: Payer: Self-pay | Admitting: Surgery

## 2017-10-08 ENCOUNTER — Ambulatory Visit (INDEPENDENT_AMBULATORY_CARE_PROVIDER_SITE_OTHER): Payer: Medicaid Other | Admitting: Surgery

## 2017-10-08 VITALS — BP 116/70 | HR 64 | Resp 10 | Ht 64.0 in | Wt 211.8 lb

## 2017-10-08 DIAGNOSIS — K8000 Calculus of gallbladder with acute cholecystitis without obstruction: Secondary | ICD-10-CM | POA: Diagnosis not present

## 2017-10-08 NOTE — Patient Instructions (Signed)
Schedule patient for surgery for gall stones.

## 2017-10-08 NOTE — Progress Notes (Signed)
Christy Williams is an 42 y.o. female.   Chief Complaint: gallstones Consult requested by Dr Nelson Chimes HPI: Known gallstones for years.  Patient has had recurrent and episodic right upper quadrant pain sometimes associated with fatty food intolerance.  She is had no diarrhea but no fevers or chills she is had nausea but no emesis.  This been going on for several years and it happens approximately twice a week lasting several hours.  He has no family history of gallbladder disease is currently not working but does smoke tobacco products does not drink alcohol.  Past Medical History:  Diagnosis Date  . Anemia 2000    Past Surgical History:  Procedure Laterality Date  . CARPAL TUNNEL RELEASE Right 04/04/2016   Procedure: CARPAL TUNNEL RELEASE;  Surgeon: Deeann Saint, MD;  Location: [facility] ORS;  Service: Orthopedics;  Laterality: Right;  Possible Carpal Tunnel Release   . CESAREAN SECTION  2008  . OPEN REDUCTION INTERNAL FIXATION (ORIF) DISTAL RADIAL FRACTURE Right 04/04/2016   Procedure: OPEN REDUCTION INTERNAL FIXATION (ORIF) DISTAL RADIAL FRACTURE;  Surgeon: Deeann Saint, MD;  Location: [facility] ORS;  Service: Orthopedics;  Laterality: Right;  . OPEN REDUCTION INTERNAL FIXATION (ORIF) WRIST WITH RADIAL STYLOIDECTOMY Right 04/04/2016   Procedure: OPEN REDUCTION INTERNAL FIXATION (ORIF) WRIST WITH RADIAL STYLOIDECTOMY;  Surgeon: Deeann Saint, MD;  Location: [facility] ORS;  Service: Orthopedics;  Laterality: Right;    Family History  Problem Relation Age of Onset  . Breast cancer Maternal Grandmother    Social History:  reports that she has been smoking. She has a 2.50 pack-year smoking history. She has never used smokeless tobacco. She reports that she drinks about 2.0 standard drinks of alcohol per week. Her drug history is not on file.  Allergies:  Allergies  Allergen Reactions  . Penicillins Itching     (Not in a hospital admission)   Review of Systems:   Review of Systems   Constitutional: Negative.   HENT: Negative.   Eyes: Negative.   Respiratory: Negative.   Cardiovascular: Negative.   Gastrointestinal: Positive for abdominal pain and nausea. Negative for blood in stool, constipation, diarrhea, heartburn and vomiting.  Genitourinary: Negative.   Musculoskeletal: Negative.   Skin: Negative.   Neurological: Negative.   Endo/Heme/Allergies: Negative.   Psychiatric/Behavioral: Negative.     Physical Exam:  Physical Exam  Constitutional: She is oriented to person, place, and time. She appears well-developed and well-nourished.  Non-toxic appearance. She does not appear ill.  HENT:  Head: Normocephalic and atraumatic.  Eyes: Pupils are equal, round, and reactive to light. EOM are normal.  Cardiovascular: Normal rate, regular rhythm and normal heart sounds.  Pulmonary/Chest: Effort normal and breath sounds normal. No stridor. No respiratory distress. She has no wheezes.  Abdominal: Normal appearance. There is no tenderness. There is no rebound and negative Murphy's sign. No hernia. Hernia confirmed negative in the ventral area.  Pfannenstiel scar Nontender abdomen  Neurological: She is alert and oriented to person, place, and time.  Skin: Skin is warm and dry. No erythema.  Vitals reviewed.   There were no vitals taken for this visit.    No results found for this or any previous visit (from the past 48 hour(s)). No results found.   Assessment/Plan Ultrasound from 2 years ago shows gallstones. Recent liver function tests are normal. This is a patient with long-standing gallbladder disease.  She had an ultrasound done several years ago.  It showed called stones and she is recently had blood tests showing  normal liver functions.  I recommended laparoscopic cholecystectomy for control of her symptoms.  The rationale for this is been discussed the options of observation reviewed and the risk of bleeding infection recurrence of symptoms failure to  resolve her symptoms conversion to an open procedure bile duct damage bile duct leak retained common bile duct stone in it which could require further surgery and/or ERCP stent and papillotomy were all reviewed with her she understood and agreed to proceed.  Lattie Haw, MD, FACS

## 2017-10-08 NOTE — Progress Notes (Signed)
The patient is scheduled for surgery at [facility] on 10/18/17. She will pre admit at the hospital. The patient is aware of date and instructions.

## 2017-10-11 ENCOUNTER — Other Ambulatory Visit: Payer: Self-pay

## 2017-10-11 ENCOUNTER — Encounter
Admission: RE | Admit: 2017-10-11 | Discharge: 2017-10-11 | Disposition: A | Discharge status: Home / Self Care | Payer: Medicaid Other | Source: Ambulatory Visit | Attending: Surgery | Admitting: Surgery

## 2017-10-11 NOTE — Patient Instructions (Signed)
  Your procedure is scheduled on: Friday October 18, 2017 Report to Same Day Surgery 2nd floor medical mall (Medical Mall Entrance-take elevator on left to 2nd floor.  Check in with surgery information desk.) To find out your arrival time please call [phone] between 1PM - 3PM on Thursday October 17, 2017  Remember: Instructions that are not followed completely may result in serious medical risk, up to and including death, or upon the discretion of your surgeon and anesthesiologist your surgery may need to be rescheduled.    _x___ 1. Do not eat food (including mints, candies, chewing gum) after midnight the night before your procedure. You may drink clear liquids up to 2 hours before you are scheduled to arrive at the hospital for your procedure.  Do not drink clear liquids within 2 hours of your scheduled arrival to the hospital.  Clear liquids include  --Water or Apple juice without pulp  --Clear carbohydrate beverage such as Gatorade  --Black Coffee or Clear Tea (No milk, no creamers, do not add anything to the coffee or tea)    __x__ 2. No Alcohol for 24 hours before or after surgery.   __x__ 3. No Smoking or e-cigarettes for 24 prior to surgery.  Do not use any chewable tobacco products for at least 6 hour prior to surgery   __x__ 4. Notify your doctor if there is any change in your medical condition (cold, fever, infections).   __x__ 5. On the morning of surgery brush your teeth with toothpaste and water.  You may rinse your mouth with mouth wash if you wish.  Do not swallow any toothpaste or mouthwash.  Please read over the following fact sheets that you were given:   [facility] Preparing for Surgery and or MRSA Information    __x__ Use CHG Soap or sage wipes as directed on instruction sheet    Do not wear jewelry, make-up, hairpins, clips or nail polish.  Do not wear lotions, powders, deodorant, or perfumes.   Do not shave below the face/neck 48 hours prior to surgery.    Do not bring valuables to the hospital.    [facility] is not responsible for any belongings or valuables.               Contacts, dentures or bridgework may not be worn into surgery.  Leave your suitcase in the car. After surgery it may be brought to your room.  For patients admitted to the hospital, discharge time is determined by your treatment team.  For patients discharged on the day of surgery, you will NOT be permitted to drive yourself home.   _x___ Take anti-hypertensive listed below, cardiac, seizure, asthma, anti-reflux and psychiatric medicines. These include:  1. Fluoxetine/Prozac  2. Diphenydramine/Benadryl  _x___ Follow recommendations from Cardiologist, Pulmonologist or PCP regarding stopping Aspirin, Coumadin, Plavix ,Eliquis, Effient, or Pradaxa, and Pletal.  _x___ Stop Anti-inflammatories such as Advil, Aleve, Ibuprofen, Motrin, Naproxen, Naprosyn, Goodies powders or aspirin products. OK to take Tylenol and Celebrex.   _x___ NOW: Stop supplements (Biotin) until after surgery.

## 2017-10-14 ENCOUNTER — Inpatient Hospital Stay: Admission: RE | Admit: 2017-10-14 | Payer: Medicaid Other | Source: Ambulatory Visit

## 2017-10-16 ENCOUNTER — Inpatient Hospital Stay: Admission: RE | Admit: 2017-10-16 | Payer: Medicaid Other | Source: Ambulatory Visit

## 2017-10-17 ENCOUNTER — Encounter
Admission: RE | Admit: 2017-10-17 | Discharge: 2017-10-17 | Disposition: A | Discharge status: Home / Self Care | Payer: Medicaid Other | Source: Ambulatory Visit | Attending: Surgery | Admitting: Surgery

## 2017-10-17 DIAGNOSIS — Z01812 Encounter for preprocedural laboratory examination: Secondary | ICD-10-CM | POA: Diagnosis not present

## 2017-10-17 LAB — COMPREHENSIVE METABOLIC PANEL
ALK PHOS: 62 U/L (ref 38–126)
ALT: 23 U/L (ref 0–44)
AST: 23 U/L (ref 15–41)
Albumin: 4.2 g/dL (ref 3.5–5.0)
Anion gap: 6 (ref 5–15)
BUN: 11 mg/dL (ref 6–20)
CALCIUM: 9.6 mg/dL (ref 8.9–10.3)
CO2: 27 mmol/L (ref 22–32)
CREATININE: 0.82 mg/dL (ref 0.44–1.00)
Chloride: 104 mmol/L (ref 98–111)
Glucose, Bld: 91 mg/dL (ref 70–99)
Potassium: 4 mmol/L (ref 3.5–5.1)
Sodium: 137 mmol/L (ref 135–145)
Total Bilirubin: 0.3 mg/dL (ref 0.3–1.2)
Total Protein: 7.7 g/dL (ref 6.5–8.1)

## 2017-10-17 LAB — CBC WITH DIFFERENTIAL/PLATELET
Basophils Absolute: 0.1 10*3/uL (ref 0–0.1)
Basophils Relative: 1 %
Eosinophils Absolute: 0.3 10*3/uL (ref 0–0.7)
Eosinophils Relative: 5 %
HEMATOCRIT: 33.2 % — AB (ref 35.0–47.0)
HEMOGLOBIN: 10.7 g/dL — AB (ref 12.0–16.0)
LYMPHS ABS: 2.8 10*3/uL (ref 1.0–3.6)
LYMPHS PCT: 42 %
MCH: 24.6 pg — AB (ref 26.0–34.0)
MCHC: 32.2 g/dL (ref 32.0–36.0)
MCV: 76.4 fL — AB (ref 80.0–100.0)
Monocytes Absolute: 0.5 10*3/uL (ref 0.2–0.9)
Monocytes Relative: 7 %
Neutro Abs: 3 10*3/uL (ref 1.4–6.5)
Neutrophils Relative %: 45 %
Platelets: 437 10*3/uL (ref 150–440)
RBC: 4.35 MIL/uL (ref 3.80–5.20)
RDW: 18.5 % — ABNORMAL HIGH (ref 11.5–14.5)
WBC: 6.7 10*3/uL (ref 3.6–11.0)

## 2017-10-17 MED ORDER — CIPROFLOXACIN IN D5W 400 MG/200ML IV SOLN
400.0000 mg | INTRAVENOUS | Status: AC
  Administered 2017-10-18: 400 mg via INTRAVENOUS

## 2017-10-18 ENCOUNTER — Encounter: Payer: Self-pay | Admitting: Certified Registered Nurse Anesthetist

## 2017-10-18 ENCOUNTER — Other Ambulatory Visit: Payer: Self-pay

## 2017-10-18 ENCOUNTER — Ambulatory Visit: Payer: Medicaid Other | Admitting: Certified Registered Nurse Anesthetist

## 2017-10-18 ENCOUNTER — Ambulatory Visit
Admission: RE | Admit: 2017-10-18 | Discharge: 2017-10-18 | Disposition: A | Discharge status: Home / Self Care | Payer: Medicaid Other | Source: Ambulatory Visit | Attending: Surgery | Admitting: Surgery

## 2017-10-18 ENCOUNTER — Encounter
Admission: RE | Disposition: A | Discharge status: Home / Self Care | Payer: Self-pay | Source: Ambulatory Visit | Attending: Surgery

## 2017-10-18 DIAGNOSIS — K8000 Calculus of gallbladder with acute cholecystitis without obstruction: Secondary | ICD-10-CM | POA: Diagnosis not present

## 2017-10-18 DIAGNOSIS — F17210 Nicotine dependence, cigarettes, uncomplicated: Secondary | ICD-10-CM | POA: Diagnosis not present

## 2017-10-18 DIAGNOSIS — K8010 Calculus of gallbladder with chronic cholecystitis without obstruction: Secondary | ICD-10-CM | POA: Insufficient documentation

## 2017-10-18 HISTORY — PX: CHOLECYSTECTOMY: SHX55

## 2017-10-18 LAB — URINE DRUG SCREEN, QUALITATIVE (ARMC ONLY)
Amphetamines, Ur Screen: NOT DETECTED
BARBITURATES, UR SCREEN: NOT DETECTED
BENZODIAZEPINE, UR SCRN: NOT DETECTED
Cannabinoid 50 Ng, Ur ~~LOC~~: NOT DETECTED
Cocaine Metabolite,Ur ~~LOC~~: NOT DETECTED
MDMA (Ecstasy)Ur Screen: NOT DETECTED
METHADONE SCREEN, URINE: NOT DETECTED
Opiate, Ur Screen: NOT DETECTED
Phencyclidine (PCP) Ur S: NOT DETECTED
TRICYCLIC, UR SCREEN: NOT DETECTED

## 2017-10-18 LAB — POCT PREGNANCY, URINE: PREG TEST UR: NEGATIVE

## 2017-10-18 SURGERY — LAPAROSCOPIC CHOLECYSTECTOMY
Anesthesia: General | Site: Abdomen

## 2017-10-18 MED ORDER — CHLORHEXIDINE GLUCONATE CLOTH 2 % EX PADS: 6.0000 | MEDICATED_PAD | Freq: Once | CUTANEOUS | Status: DC

## 2017-10-18 MED ORDER — MIDAZOLAM HCL 2 MG/2ML IJ SOLN
INTRAMUSCULAR | Status: AC
  Filled 2017-10-18: qty 2

## 2017-10-18 MED ORDER — FENTANYL CITRATE (PF) 250 MCG/5ML IJ SOLN
INTRAMUSCULAR | Status: AC
  Filled 2017-10-18: qty 5

## 2017-10-18 MED ORDER — BUPIVACAINE-EPINEPHRINE (PF) 0.25% -1:200000 IJ SOLN
INTRAMUSCULAR | Status: DC | PRN
  Administered 2017-10-18: 30 mL

## 2017-10-18 MED ORDER — CIPROFLOXACIN IN D5W 400 MG/200ML IV SOLN
INTRAVENOUS | Status: AC
  Filled 2017-10-18: qty 200

## 2017-10-18 MED ORDER — LORAZEPAM 2 MG/ML IJ SOLN: 1.0000 mg | Freq: Once | INTRAMUSCULAR | Status: DC

## 2017-10-18 MED ORDER — HYDROCODONE-ACETAMINOPHEN 5-300 MG PO TABS: 1.0000 | ORAL_TABLET | ORAL | 0 refills | Status: DC | PRN

## 2017-10-18 MED ORDER — SUGAMMADEX SODIUM 200 MG/2ML IV SOLN
INTRAVENOUS | Status: AC
  Filled 2017-10-18: qty 2

## 2017-10-18 MED ORDER — ROCURONIUM BROMIDE 100 MG/10ML IV SOLN
INTRAVENOUS | Status: DC | PRN
  Administered 2017-10-18: 40 mg via INTRAVENOUS

## 2017-10-18 MED ORDER — ACETAMINOPHEN NICU IV SYRINGE 10 MG/ML
INTRAVENOUS | Status: AC
  Filled 2017-10-18: qty 1

## 2017-10-18 MED ORDER — LORAZEPAM 2 MG/ML IJ SOLN
INTRAMUSCULAR | Status: AC
  Filled 2017-10-18: qty 1

## 2017-10-18 MED ORDER — FENTANYL CITRATE (PF) 100 MCG/2ML IJ SOLN: 25.0000 ug | INTRAMUSCULAR | Status: DC | PRN

## 2017-10-18 MED ORDER — MIDAZOLAM HCL 2 MG/2ML IJ SOLN
INTRAMUSCULAR | Status: DC | PRN
  Administered 2017-10-18: 2 mg via INTRAVENOUS

## 2017-10-18 MED ORDER — DEXAMETHASONE SODIUM PHOSPHATE 10 MG/ML IJ SOLN
INTRAMUSCULAR | Status: DC | PRN
  Administered 2017-10-18: 5 mg via INTRAVENOUS

## 2017-10-18 MED ORDER — FAMOTIDINE 20 MG PO TABS
ORAL_TABLET | ORAL | Status: AC
  Filled 2017-10-18: qty 1

## 2017-10-18 MED ORDER — PROPOFOL 10 MG/ML IV BOLUS
INTRAVENOUS | Status: DC | PRN
  Administered 2017-10-18: 150 mg via INTRAVENOUS

## 2017-10-18 MED ORDER — LIDOCAINE HCL (PF) 2 % IJ SOLN
INTRAMUSCULAR | Status: AC
  Filled 2017-10-18: qty 10

## 2017-10-18 MED ORDER — FENTANYL CITRATE (PF) 100 MCG/2ML IJ SOLN
INTRAMUSCULAR | Status: DC | PRN
  Administered 2017-10-18: 50 ug via INTRAVENOUS
  Administered 2017-10-18 (×2): 100 ug via INTRAVENOUS

## 2017-10-18 MED ORDER — LACTATED RINGERS IV SOLN
INTRAVENOUS | Status: DC
  Administered 2017-10-18: 13:00:00 via INTRAVENOUS

## 2017-10-18 MED ORDER — ROCURONIUM BROMIDE 50 MG/5ML IV SOLN
INTRAVENOUS | Status: AC
  Filled 2017-10-18: qty 1

## 2017-10-18 MED ORDER — HEPARIN SODIUM (PORCINE) 5000 UNIT/ML IJ SOLN
INTRAMUSCULAR | Status: AC
  Administered 2017-10-18: 5000 [IU] via SUBCUTANEOUS
  Filled 2017-10-18: qty 1

## 2017-10-18 MED ORDER — OXYCODONE HCL 5 MG PO TABS: 5.0000 mg | ORAL_TABLET | Freq: Once | ORAL | Status: DC | PRN

## 2017-10-18 MED ORDER — ONDANSETRON HCL 4 MG/2ML IJ SOLN
INTRAMUSCULAR | Status: AC
  Filled 2017-10-18: qty 2

## 2017-10-18 MED ORDER — HEPARIN SODIUM (PORCINE) 5000 UNIT/ML IJ SOLN
5000.0000 [IU] | Freq: Once | INTRAMUSCULAR | Status: AC
  Administered 2017-10-18: 5000 [IU] via SUBCUTANEOUS

## 2017-10-18 MED ORDER — FAMOTIDINE 20 MG PO TABS
20.0000 mg | ORAL_TABLET | Freq: Once | ORAL | Status: AC
  Administered 2017-10-18: 20 mg via ORAL

## 2017-10-18 MED ORDER — LIDOCAINE HCL (CARDIAC) PF 100 MG/5ML IV SOSY
PREFILLED_SYRINGE | INTRAVENOUS | Status: DC | PRN
  Administered 2017-10-18: 100 mg via INTRAVENOUS

## 2017-10-18 MED ORDER — SUGAMMADEX SODIUM 200 MG/2ML IV SOLN
INTRAVENOUS | Status: DC | PRN
  Administered 2017-10-18: 200 mg via INTRAVENOUS

## 2017-10-18 MED ORDER — PROPOFOL 10 MG/ML IV BOLUS
INTRAVENOUS | Status: AC
  Filled 2017-10-18: qty 20

## 2017-10-18 MED ORDER — ACETAMINOPHEN 10 MG/ML IV SOLN
INTRAVENOUS | Status: DC | PRN
  Administered 2017-10-18: 1000 mg via INTRAVENOUS

## 2017-10-18 MED ORDER — OXYCODONE HCL 5 MG/5ML PO SOLN: 5.0000 mg | Freq: Once | ORAL | Status: DC | PRN

## 2017-10-18 MED ORDER — BUPIVACAINE-EPINEPHRINE (PF) 0.25% -1:200000 IJ SOLN
INTRAMUSCULAR | Status: AC
  Filled 2017-10-18: qty 30

## 2017-10-18 MED ORDER — ONDANSETRON HCL 4 MG/2ML IJ SOLN
INTRAMUSCULAR | Status: DC | PRN
  Administered 2017-10-18: 4 mg via INTRAVENOUS

## 2017-10-18 SURGICAL SUPPLY — 41 items
ADHESIVE MASTISOL STRL (MISCELLANEOUS) ×2 IMPLANT
APPLIER CLIP ROT 10 11.4 M/L (STAPLE) ×2
BLADE SURG SZ11 CARB STEEL (BLADE) ×2 IMPLANT
CANISTER SUCT 1200ML W/VALVE (MISCELLANEOUS) ×2 IMPLANT
CATH CHOLANGI 4FR 420404F (CATHETERS) IMPLANT
CHLORAPREP W/TINT 26ML (MISCELLANEOUS) ×2 IMPLANT
CLIP APPLIE ROT 10 11.4 M/L (STAPLE) ×1 IMPLANT
CONRAY 60ML FOR OR (MISCELLANEOUS) IMPLANT
DRAPE C-ARM XRAY 36X54 (DRAPES) IMPLANT
ELECT REM PT RETURN 9FT ADLT (ELECTROSURGICAL) ×2
ELECTRODE REM PT RTRN 9FT ADLT (ELECTROSURGICAL) ×1 IMPLANT
GLOVE BIO SURGEON STRL SZ8 (GLOVE) ×2 IMPLANT
GOWN STRL REUS W/ TWL LRG LVL3 (GOWN DISPOSABLE) ×4 IMPLANT
GOWN STRL REUS W/TWL LRG LVL3 (GOWN DISPOSABLE) ×4
IRRIGATION STRYKERFLOW (MISCELLANEOUS) ×1 IMPLANT
IRRIGATOR STRYKERFLOW (MISCELLANEOUS) ×2
IV CATH ANGIO 12GX3 LT BLUE (NEEDLE) ×2 IMPLANT
IV NS 1000ML (IV SOLUTION) ×1
IV NS 1000ML BAXH (IV SOLUTION) ×1 IMPLANT
JACKSON PRATT 10 (INSTRUMENTS) IMPLANT
KIT TURNOVER KIT A (KITS) ×2 IMPLANT
LABEL OR SOLS (LABEL) ×2 IMPLANT
NEEDLE HYPO 22GX1.5 SAFETY (NEEDLE) ×2 IMPLANT
NEEDLE VERESS 14GA 120MM (NEEDLE) ×2 IMPLANT
NS IRRIG 500ML POUR BTL (IV SOLUTION) ×2 IMPLANT
PACK LAP CHOLECYSTECTOMY (MISCELLANEOUS) ×2 IMPLANT
POUCH SPECIMEN RETRIEVAL 10MM (ENDOMECHANICALS) ×2 IMPLANT
SCISSORS METZENBAUM CVD 33 (INSTRUMENTS) ×2 IMPLANT
SLEEVE ENDOPATH XCEL 5M (ENDOMECHANICALS) ×4 IMPLANT
SPONGE GAUZE 2X2 8PLY STRL LF (GAUZE/BANDAGES/DRESSINGS) ×8 IMPLANT
SPONGE LAP 18X18 RF (DISPOSABLE) ×2 IMPLANT
SPONGE VERSALON 4X4 4PLY (MISCELLANEOUS) IMPLANT
STRIP CLOSURE SKIN 1/2X4 (GAUZE/BANDAGES/DRESSINGS) ×2 IMPLANT
SUT MNCRL 4-0 (SUTURE) ×1
SUT MNCRL 4-0 27XMFL (SUTURE) ×1
SUT VICRYL 0 AB UR-6 (SUTURE) ×2 IMPLANT
SUTURE MNCRL 4-0 27XMF (SUTURE) ×1 IMPLANT
SYR 20CC LL (SYRINGE) ×2 IMPLANT
TROCAR XCEL NON-BLD 11X100MML (ENDOMECHANICALS) ×2 IMPLANT
TROCAR XCEL NON-BLD 5MMX100MML (ENDOMECHANICALS) ×2 IMPLANT
TUBING INSUFFLATION (TUBING) ×2 IMPLANT

## 2017-10-18 NOTE — Progress Notes (Signed)
Preoperative Review   Patient is met in the preoperative holding area. The history is reviewed in the chart and with the patient. I personally reviewed the options and rationale as well as the risks of this procedure that have been previously discussed with the patient. All questions asked by the patient and/or family were answered to their satisfaction.  Patient agrees to proceed with this procedure at this time.  Osby Sweetin E Donisha Hoch M.D. FACS  

## 2017-10-18 NOTE — Anesthesia Preprocedure Evaluation (Signed)
Anesthesia Evaluation  Patient identified by MRN, date of birth, ID band Patient awake    Reviewed: Allergy & Precautions, H&P , NPO status , Patient's Chart, lab work & pertinent test results  History of Anesthesia Complications Negative for: history of anesthetic complications  Airway Mallampati: II  TM Distance: >3 FB Neck ROM: full    Dental  (+) Chipped, Poor Dentition, Missing   Pulmonary neg shortness of breath, Current Smoker,           Cardiovascular Exercise Tolerance: Good (-) angina(-) Past MI and (-) DOE negative cardio ROS       Neuro/Psych negative neurological ROS  negative psych ROS   GI/Hepatic negative GI ROS, Neg liver ROS, neg GERD  ,  Endo/Other  negative endocrine ROS  Renal/GU      Musculoskeletal   Abdominal   Peds  Hematology negative hematology ROS (+)   Anesthesia Other Findings Past Medical History: 2000: Anemia  Past Surgical History: 04/04/2016: CARPAL TUNNEL RELEASE; Right     Comment:  Procedure: CARPAL TUNNEL RELEASE;  Surgeon: Deeann SaintMiller,               Howard, MD;  Location: [facility] ORS;  Service: Orthopedics;                Laterality: Right;  Possible Carpal Tunnel Release  2008: CESAREAN SECTION 04/04/2016: OPEN REDUCTION INTERNAL FIXATION (ORIF) DISTAL RADIAL  FRACTURE; Right     Comment:  Procedure: OPEN REDUCTION INTERNAL FIXATION (ORIF)               DISTAL RADIAL FRACTURE;  Surgeon: Deeann SaintMiller, Howard, MD;                Location: [facility] ORS;  Service: Orthopedics;  Laterality:               Right; 04/04/2016: OPEN REDUCTION INTERNAL FIXATION (ORIF) WRIST WITH RADIAL  STYLOIDECTOMY; Right     Comment:  Procedure: OPEN REDUCTION INTERNAL FIXATION (ORIF) WRIST              WITH RADIAL STYLOIDECTOMY;  Surgeon: Deeann SaintMiller, Howard, MD;               Location: [facility] ORS;  Service: Orthopedics;  Laterality:               Right;     Reproductive/Obstetrics negative OB ROS                              Anesthesia Physical Anesthesia Plan  ASA: II  Anesthesia Plan: General   Post-op Pain Management:    Induction: Intravenous  PONV Risk Score and Plan: Ondansetron, Dexamethasone, Midazolam and Treatment may vary due to age or medical condition  Airway Management Planned: Oral ETT  Additional Equipment:   Intra-op Plan:   Post-operative Plan:   Informed Consent: I have reviewed the patients History and Physical, chart, labs and discussed the procedure including the risks, benefits and alternatives for the proposed anesthesia with the patient or authorized representative who has indicated his/her understanding and acceptance.   Dental Advisory Given  Plan Discussed with: Anesthesiologist, CRNA and Surgeon  Anesthesia Plan Comments: (Patient consented for risks of anesthesia including but not limited to:  - adverse reactions to medications - risk of intubation if required - damage to teeth, lips or other oral mucosa - sore throat or hoarseness - Damage to heart, brain, lungs or loss of life  Patient voiced understanding.)        Anesthesia Quick Evaluation

## 2017-10-18 NOTE — Op Note (Signed)
Laparoscopic Cholecystectomy  Pre-operative Diagnosis: Biliary colic  Post-operative Diagnosis: Same  Procedure: Laparoscopic cholecystectomy  Surgeon: Adah Salvageichard E. Excell Seltzerooper, MD FACS  Anesthesia: Gen. with endotracheal tube  Assistant: Surgical tech  Procedure Details  The patient was seen again in the Holding Room. The benefits, complications, treatment options, and expected outcomes were discussed with the patient. The risks of bleeding, infection, recurrence of symptoms, failure to resolve symptoms, bile duct damage, bile duct leak, retained common bile duct stone, bowel injury, any of which could require further surgery and/or ERCP, stent, or papillotomy were reviewed with the patient. The likelihood of improving the patient's symptoms with return to their baseline status is good.  The patient and/or family concurred with the proposed plan, giving informed consent.  The patient was taken to Operating Room, identified as Christy MedinaNyshia Dekima Williams and the procedure verified as Laparoscopic Cholecystectomy.  A Time Out was held and the above information confirmed.  Prior to the induction of general anesthesia, antibiotic prophylaxis was administered. VTE prophylaxis was in place. General endotracheal anesthesia was then administered and tolerated well. After the induction, the abdomen was prepped with Chloraprep and draped in the sterile fashion. The patient was positioned in the supine position.  Local anesthetic  was injected into the skin near the umbilicus and an incision made. The Veress needle was placed. Pneumoperitoneum was then created with CO2 and tolerated well without any adverse changes in the patient's vital signs. A 5mm port was placed in the periumbilical position and the abdominal cavity was explored.  Two 5-mm ports were placed in the right upper quadrant and a 12 mm epigastric port was placed all under direct vision. All skin incisions  were infiltrated with a local anesthetic agent  before making the incision and placing the trocars.   The patient was positioned  in reverse Trendelenburg, tilted slightly to the patient's left.  The gallbladder was identified, the fundus grasped and retracted cephalad. Adhesions were lysed bluntly. The infundibulum was grasped and retracted laterally, exposing the peritoneum overlying the triangle of Calot. This was then divided and exposed in a blunt fashion.  The cystic lymphatics were doubly clipped and divided.  A critical view of the cystic duct and cystic artery was obtained.  The cystic duct was clearly identified and bluntly dissected.   The cystic artery was doubly clipped and divided in 2 branches.  This allowed for good visualization of the cystic duct as it entered the infundibulum of the gallbladder.  Here it was doubly clipped and divided.  The gallbladder was taken from the gallbladder fossa in a retrograde fashion with the electrocautery. The gallbladder was removed and placed in an Endocatch bag. The liver bed was irrigated and inspected. Hemostasis was achieved with the electrocautery. Copious irrigation was utilized and was repeatedly aspirated until clear.  The gallbladder and Endocatch sac were then removed through the epigastric port site.  This epigastric port site required enlargement due to a very large gallstone present.  Inspection of the right upper quadrant was performed. No bleeding, bile duct injury or leak, or bowel injury was noted. Pneumoperitoneum was released.  The epigastric port site was closed with figure-of-eight 0 Vicryl sutures. 4-0 subcuticular Monocryl was used to close the skin. Steristrips and Mastisol and sterile dressings were  applied.  The patient was then extubated and brought to the recovery room in stable condition. Sponge, lap, and needle counts were correct at closure and at the conclusion of the case.   Findings: Chronic cholecystitis with  multiple large gallstones  Estimated Blood Loss:  Minimal         Drains: None         Specimens: Gallbladder           Complications: none               Caeley Dohrmann E. Excell Seltzer, MD, FACS

## 2017-10-18 NOTE — Anesthesia Postprocedure Evaluation (Signed)
Anesthesia Post Note  Patient: Christy Williams  Procedure(s) Performed: LAPAROSCOPIC CHOLECYSTECTOMY (N/A Abdomen)  Patient location during evaluation: PACU Anesthesia Type: General Level of consciousness: awake and alert Pain management: pain level controlled Vital Signs Assessment: post-procedure vital signs reviewed and stable Respiratory status: spontaneous breathing, nonlabored ventilation, respiratory function stable and patient connected to nasal cannula oxygen Cardiovascular status: blood pressure returned to baseline and stable Postop Assessment: no apparent nausea or vomiting Anesthetic complications: no     Last Vitals:  Vitals:   10/18/17 1438 10/18/17 1459  BP: 120/76 108/64  Pulse: 97 99  Resp: (!) 5 16  Temp: (!) 36.2 C 36.4 C  SpO2: 97% 99%    Last Pain:  Vitals:   10/18/17 1459  TempSrc: Temporal  PainSc: 4                  Christy Williams

## 2017-10-18 NOTE — Discharge Instructions (Signed)
Laparoscopic Cholecystectomy, Care After This sheet gives you information about how to care for yourself after your procedure. Your doctor may also give you more specific instructions. If you have problems or questions, contact your doctor. Follow these instructions at home: Care for cuts from surgery (incisions)   Follow instructions from your doctor about how to take care of your cuts from surgery. Make sure you: ? Wash your hands with soap and water before you change your bandage (dressing). If you cannot use soap and water, use hand sanitizer. ? Change your bandage as told by your doctor. ? Leave stitches (sutures), skin glue, or skin tape (adhesive) strips in place. They may need to stay in place for 2 weeks or longer. If tape strips get loose and curl up, you may trim the loose edges. Do not remove tape strips completely unless your doctor says it is okay.  Do not take baths, swim, or use a hot tub until your doctor says it is okay. Ask your doctor if you can take showers. You may only be allowed to take sponge baths for bathing.  Check your surgical cut area every day for signs of infection. Check for: ? More redness, swelling, or pain. ? More fluid or blood. ? Warmth. ? Pus or a bad smell. Activity  Do not drive or use heavy machinery while taking prescription pain medicine.  Do not lift anything that is heavier than 10 lb (4.5 kg) until your doctor says it is okay.  Do not play contact sports until your doctor says it is okay.  Do not drive for 24 hours if you were given a medicine to help you relax (sedative).  Rest as needed. Do not return to work or school until your doctor says it is okay. General instructions  Take over-the-counter and prescription medicines only as told by your doctor.  To prevent or treat constipation while you are taking prescription pain medicine, your doctor may recommend that you: ? Drink enough fluid to keep your pee (urine) clear or pale  yellow. ? Take over-the-counter or prescription medicines. ? Eat foods that are high in fiber, such as fresh fruits and vegetables, whole grains, and beans. ? Limit foods that are high in fat and processed sugars, such as fried and sweet foods. Contact a doctor if:  You develop a rash.  You have more redness, swelling, or pain around your surgical cuts.  You have more fluid or blood coming from your surgical cuts.  Your surgical cuts feel warm to the touch.  You have pus or a bad smell coming from your surgical cuts.  You have a fever.  One or more of your surgical cuts breaks open. Get help right away if:  You have trouble breathing.  You have chest pain.  You have pain that is getting worse in your shoulders.  You faint or feel dizzy when you stand.  You have very bad pain in your belly (abdomen).  You are sick to your stomach (nauseous) for more than one day.  You have throwing up (vomiting) that lasts for more than one day.  You have leg pain. This information is not intended to replace advice given to you by your health care provider. Make sure you discuss any questions you have with your health care provider. Document Released: 11/01/2007 Document Revised: 08/13/2015 Document Reviewed: 07/11/2015 Elsevier Interactive Patient Education  2018 [employer]    AMBULATORY SURGERY  DISCHARGE INSTRUCTIONS   1) The drugs that you  were given will stay in your system until tomorrow so for the next 24 hours you should not:  A) Drive an automobile B) Make any legal decisions C) Drink any alcoholic beverage   2) You may resume regular meals tomorrow.  Today it is better to start with liquids and gradually work up to solid foods.  You may eat anything you prefer, but it is better to start with liquids, then soup and crackers, and gradually work up to solid foods.   3) Please notify your doctor immediately if you have any unusual bleeding, trouble breathing, redness  and pain at the surgery site, drainage, fever, or pain not relieved by medication. 4)   5) Your post-operative visit with Dr.                                     is: Date:                        Time:    Please call to schedule your post-operative visit.  6) Additional Instructions:       Remove dressing in 24 hours. May shower in 24 hours. Leave paper strips in place. Resume all home medications. Follow-up with Dr. Excell Seltzerooper in 10 days.

## 2017-10-18 NOTE — Anesthesia Post-op Follow-up Note (Signed)
Anesthesia QCDR form completed.        

## 2017-10-18 NOTE — Addendum Note (Signed)
Addendum  created 10/18/17 1622 by Stormy Fabianurtis, Saige Canton, CRNA   Charge Capture section accepted

## 2017-10-18 NOTE — Transfer of Care (Signed)
Immediate Anesthesia Transfer of Care Note  Patient: Christy Williams Weitzman  Procedure(s) Performed: LAPAROSCOPIC CHOLECYSTECTOMY (N/A Abdomen)  Patient Location: PACU  Anesthesia Type:General  Level of Consciousness: awake  Airway & Oxygen Therapy: Patient Spontanous Breathing  Post-op Assessment: Report given to RN  Post vital signs: stable  Last Vitals:  Vitals Value Taken Time  BP    Temp    Pulse 145 10/18/2017  2:05 PM  Resp    SpO2 100 % 10/18/2017  2:05 PM  Vitals shown include unvalidated device data.  Last Pain:  Vitals:   10/18/17 1227  TempSrc: Tympanic  PainSc: 0-No pain         Complications: No apparent anesthesia complications

## 2017-10-23 LAB — SURGICAL PATHOLOGY

## 2018-01-23 IMAGING — CR DG FOREARM 2V*R*
2 series · 2 of 2 positions shown · non-contrast
Comparison: Same day.

CLINICAL DATA: Status post reduction of radial fracture.

EXAM:
RIGHT FOREARM - 2 VIEW

[forearm ap]
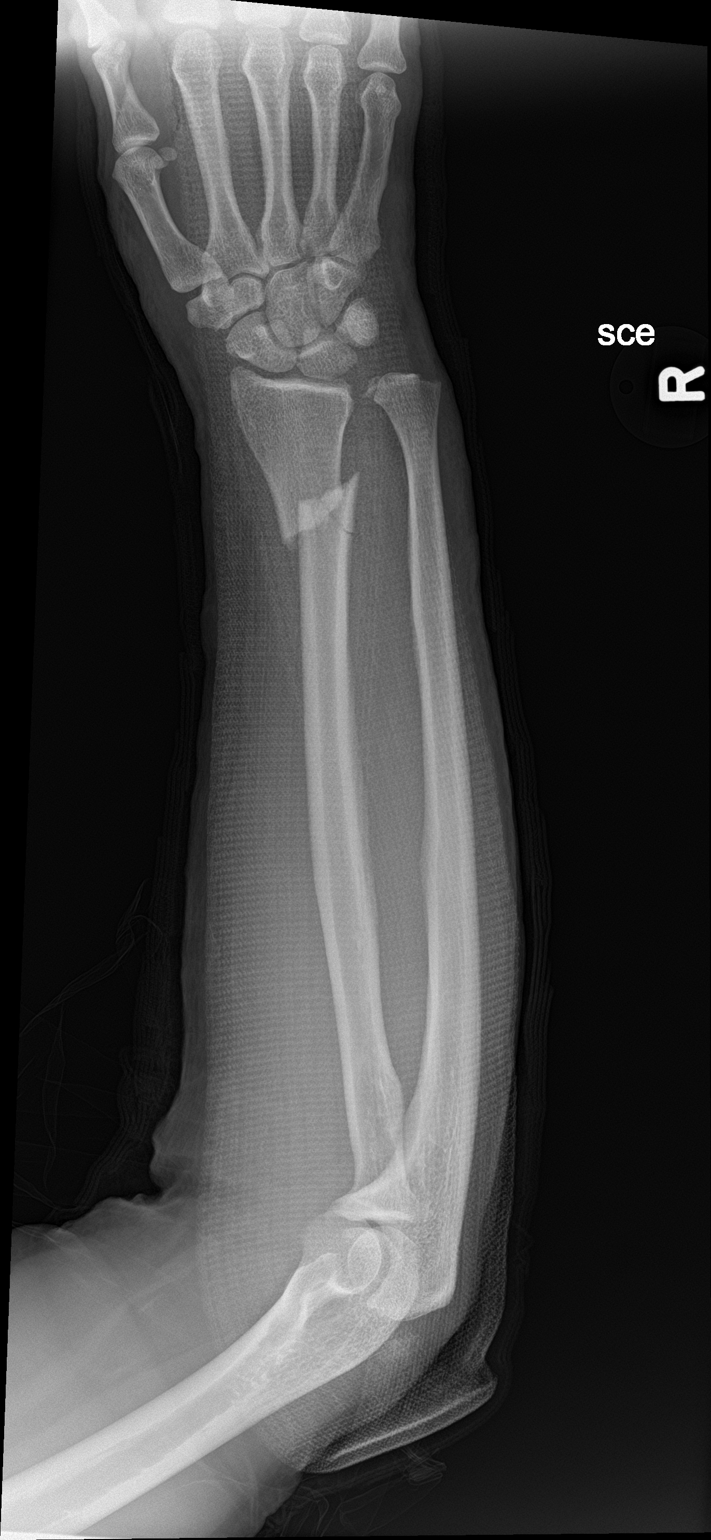

[forearm lat]
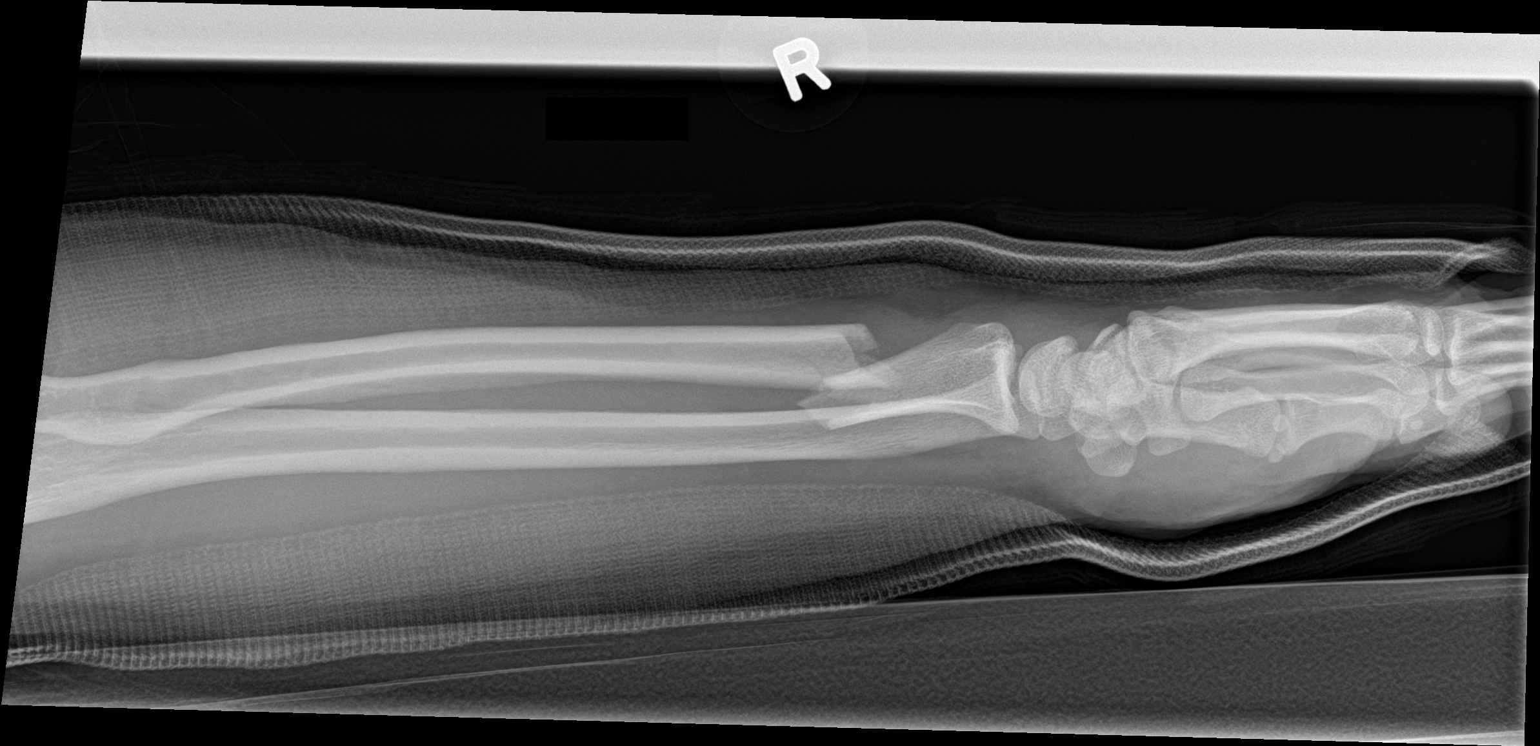

[2 of 2 positions shown; findings below may reference images not displayed]

FINDINGS: The right forearm has been casted and immobilized. Severely
displaced ulnar styloid fracture is noted. Severely displaced
comminuted fracture of distal right radius is noted with overriding
fracture fragments. This is stable in position.
IMPRESSION: Status post casting and immobilization of right forearm. Comminuted
and severely displaced distal right radial fracture is again noted
with overriding fracture fragments. Severely displaced ulnar styloid
fracture is also unchanged.

## 2018-02-17 IMAGING — DX DG FOREARM 2V*R*
2 series · 2 of 2 positions shown · non-contrast
Comparison: 03/10/2016

CLINICAL DATA: Distal radial and ulnar fractures, status post
internal fixation

EXAM:
RIGHT FOREARM - 2 VIEW

[forearm ap]
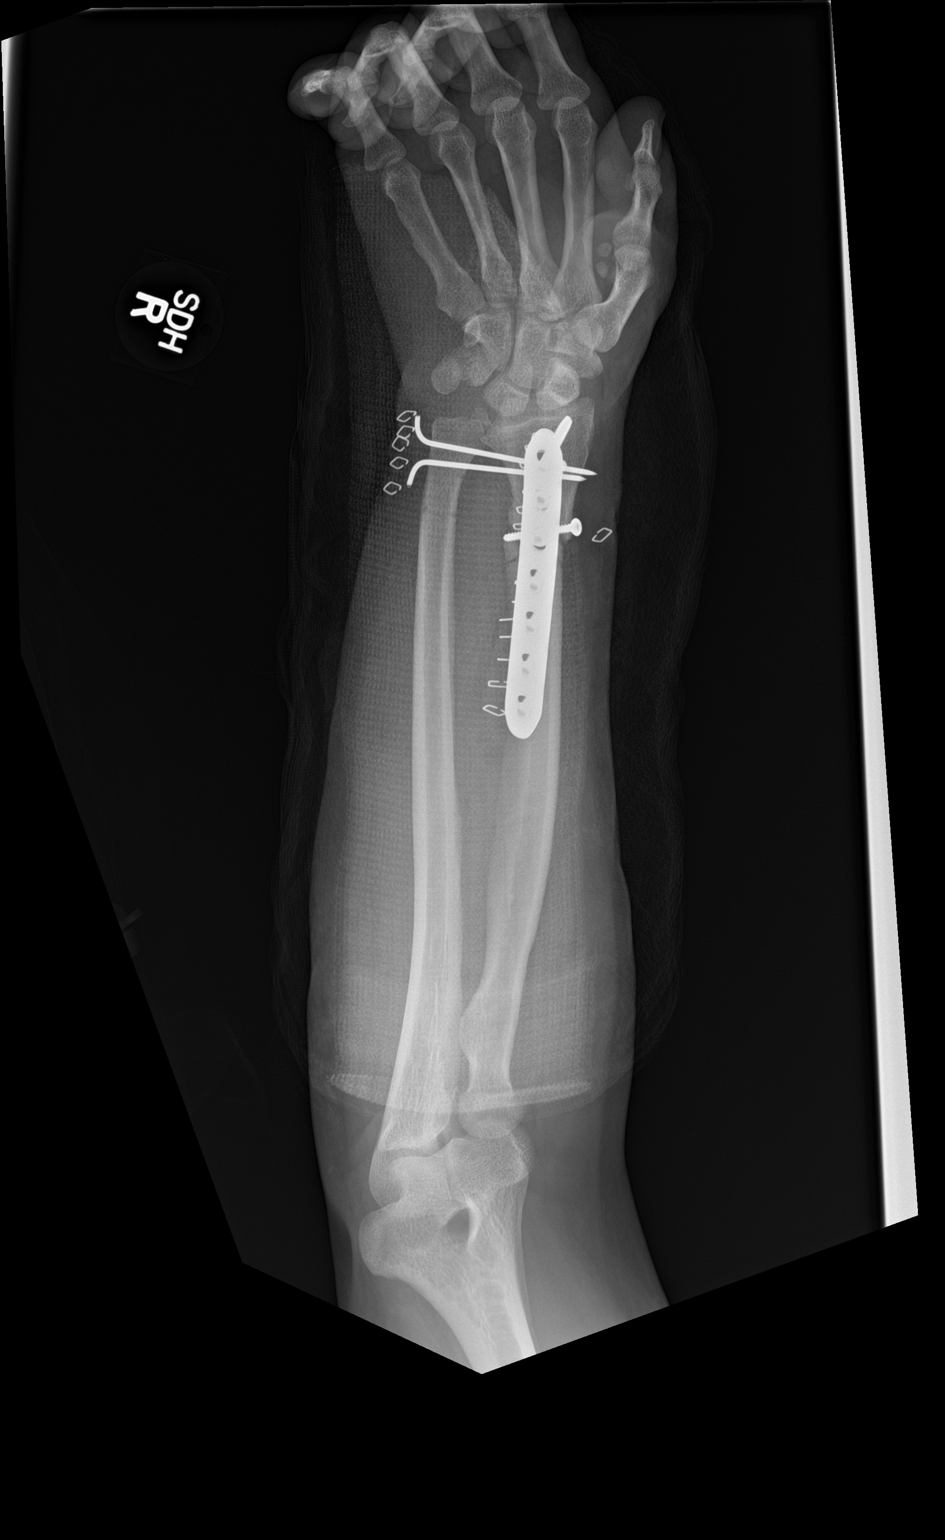

[forearm lat]
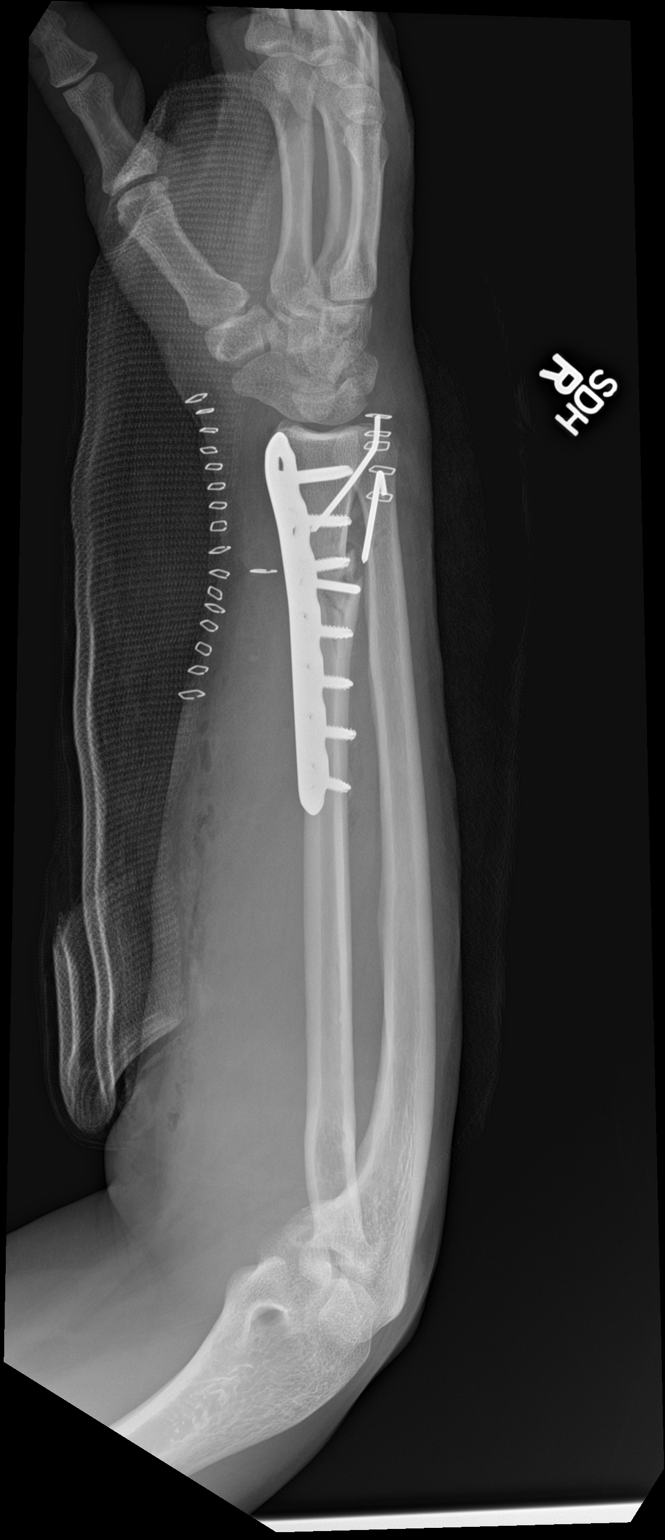

[2 of 2 positions shown; findings below may reference images not displayed]

FINDINGS: Fixation sideplate is noted along the distal radius with multiple
fixation screws identified. The fracture fragments are in near
anatomic alignment. Two fixation wires are noted traversing the
distal ulna for fixation as well. Fracture fragments are in near
anatomic alignment.
IMPRESSION: Status post ORIF of distal radial and ulnar fractures.

## 2019-04-16 ENCOUNTER — Ambulatory Visit: Payer: Medicaid Other | Admitting: Podiatry

## 2019-04-21 ENCOUNTER — Other Ambulatory Visit: Payer: Self-pay | Admitting: Physician Assistant

## 2019-04-21 DIAGNOSIS — Z1231 Encounter for screening mammogram for malignant neoplasm of breast: Secondary | ICD-10-CM

## 2020-05-08 ENCOUNTER — Emergency Department
Admission: EM | Admit: 2020-05-08 | Discharge: 2020-05-08 | Disposition: A | Discharge status: Home / Self Care | Payer: Medicaid Other | Attending: Emergency Medicine | Admitting: Emergency Medicine

## 2020-05-08 ENCOUNTER — Encounter: Payer: Self-pay | Admitting: Intensive Care

## 2020-05-08 ENCOUNTER — Emergency Department: Payer: Medicaid Other

## 2020-05-08 ENCOUNTER — Other Ambulatory Visit: Payer: Self-pay

## 2020-05-08 DIAGNOSIS — F17210 Nicotine dependence, cigarettes, uncomplicated: Secondary | ICD-10-CM | POA: Diagnosis not present

## 2020-05-08 DIAGNOSIS — M436 Torticollis: Secondary | ICD-10-CM | POA: Insufficient documentation

## 2020-05-08 DIAGNOSIS — Z91040 Latex allergy status: Secondary | ICD-10-CM | POA: Diagnosis not present

## 2020-05-08 DIAGNOSIS — R519 Headache, unspecified: Secondary | ICD-10-CM

## 2020-05-08 HISTORY — DX: Post-traumatic stress disorder, unspecified: F43.10

## 2020-05-08 HISTORY — DX: Anxiety disorder, unspecified: F41.9

## 2020-05-08 LAB — URINALYSIS, COMPLETE (UACMP) WITH MICROSCOPIC
Bilirubin Urine: NEGATIVE
Glucose, UA: NEGATIVE mg/dL
Hgb urine dipstick: NEGATIVE
Ketones, ur: NEGATIVE mg/dL
Leukocytes,Ua: NEGATIVE
Nitrite: NEGATIVE
Protein, ur: NEGATIVE mg/dL
Specific Gravity, Urine: 1.026 (ref 1.005–1.030)
pH: 5 (ref 5.0–8.0)

## 2020-05-08 LAB — CBC
HCT: 31.6 % — ABNORMAL LOW (ref 36.0–46.0)
Hemoglobin: 9.9 g/dL — ABNORMAL LOW (ref 12.0–15.0)
MCH: 24.3 pg — ABNORMAL LOW (ref 26.0–34.0)
MCHC: 31.3 g/dL (ref 30.0–36.0)
MCV: 77.5 fL — ABNORMAL LOW (ref 80.0–100.0)
Platelets: 433 10*3/uL — ABNORMAL HIGH (ref 150–400)
RBC: 4.08 MIL/uL (ref 3.87–5.11)
RDW: 18.8 % — ABNORMAL HIGH (ref 11.5–15.5)
WBC: 7.4 10*3/uL (ref 4.0–10.5)
nRBC: 0 % (ref 0.0–0.2)

## 2020-05-08 LAB — COMPREHENSIVE METABOLIC PANEL
ALT: 15 U/L (ref 0–44)
AST: 18 U/L (ref 15–41)
Albumin: 3.9 g/dL (ref 3.5–5.0)
Alkaline Phosphatase: 45 U/L (ref 38–126)
Anion gap: 7 (ref 5–15)
BUN: 12 mg/dL (ref 6–20)
CO2: 22 mmol/L (ref 22–32)
Calcium: 9.2 mg/dL (ref 8.9–10.3)
Chloride: 108 mmol/L (ref 98–111)
Creatinine, Ser: 0.7 mg/dL (ref 0.44–1.00)
GFR, Estimated: >60 mL/min (ref >60)
Glucose, Bld: 95 mg/dL (ref 70–99)
Potassium: 3.8 mmol/L (ref 3.5–5.1)
Sodium: 137 mmol/L (ref 135–145)
Total Bilirubin: 0.4 mg/dL (ref 0.3–1.2)
Total Protein: 7.3 g/dL (ref 6.5–8.1)

## 2020-05-08 LAB — POC URINE PREG, ED: Preg Test, Ur: NEGATIVE

## 2020-05-08 LAB — LIPASE, BLOOD: Lipase: 52 U/L — ABNORMAL HIGH (ref 11–51)

## 2020-05-08 MED ORDER — METOCLOPRAMIDE HCL 10 MG PO TABS
10.0000 mg | ORAL_TABLET | Freq: Once | ORAL | Status: AC
  Administered 2020-05-08: 10 mg via ORAL
  Filled 2020-05-08: qty 1

## 2020-05-08 MED ORDER — IBUPROFEN 800 MG PO TABS
800.0000 mg | ORAL_TABLET | Freq: Once | ORAL | Status: AC
  Administered 2020-05-08: 800 mg via ORAL
  Filled 2020-05-08: qty 1

## 2020-05-08 MED ORDER — CYCLOBENZAPRINE HCL 5 MG PO TABS: 5.0000 mg | ORAL_TABLET | Freq: Three times a day (TID) | ORAL | 0 refills | Status: AC | PRN

## 2020-05-08 NOTE — ED Notes (Signed)
Pt noted to be in waiting room again. Informed to stay here to get a bed.

## 2020-05-08 NOTE — ED Provider Notes (Signed)
[facility] Emergency Department Provider Note ____________________________________________  Time seen: 1829  I have reviewed the triage vital signs and the nursing notes.  HISTORY  Chief Complaint  Neck Pain and Abdominal Pain  HPI Christy Williams is a 45 y.o. female presents present to the ED for evaluation of multiple complaints.  Patient complains of neck stiffness and decreased ROM that started yesterday with associated headache.  She also reports some chronic complaints of blurry vision that has been present for years.  She notes some left-sided low back pain with referral down the leg but denies any preceding injury, trauma, or fall.  She also denies any bladder or bowel incontinence, foot drop, or saddle anesthesia.  She gives a remote history of having fluid removed from her spine and skull, several years ago and reports similar symptoms.   Past Medical History:  Diagnosis Date  . Anemia 2000  . Anxiety   . PTSD (post-traumatic stress disorder)     Patient Active Problem List   Diagnosis Date Noted  . Cholelithiasis and acute cholecystitis without obstruction   . Right wrist fracture, closed, initial encounter 04/04/2016    Past Surgical History:  Procedure Laterality Date  . CARPAL TUNNEL RELEASE Right 04/04/2016   Procedure: CARPAL TUNNEL RELEASE;  Surgeon: Deeann Saint, MD;  Location: [facility] ORS;  Service: Orthopedics;  Laterality: Right;  Possible Carpal Tunnel Release   . CESAREAN SECTION  2008  . CHOLECYSTECTOMY N/A 10/18/2017   Procedure: LAPAROSCOPIC CHOLECYSTECTOMY;  Surgeon: Lattie Haw, MD;  Location: [facility] ORS;  Service: General;  Laterality: N/A;  . OPEN REDUCTION INTERNAL FIXATION (ORIF) DISTAL RADIAL FRACTURE Right 04/04/2016   Procedure: OPEN REDUCTION INTERNAL FIXATION (ORIF) DISTAL RADIAL FRACTURE;  Surgeon: Deeann Saint, MD;  Location: [facility] ORS;  Service: Orthopedics;  Laterality: Right;  . OPEN REDUCTION INTERNAL FIXATION  (ORIF) WRIST WITH RADIAL STYLOIDECTOMY Right 04/04/2016   Procedure: OPEN REDUCTION INTERNAL FIXATION (ORIF) WRIST WITH RADIAL STYLOIDECTOMY;  Surgeon: Deeann Saint, MD;  Location: [facility] ORS;  Service: Orthopedics;  Laterality: Right;    Prior to Admission medications   Medication Sig Start Date End Date Taking? Authorizing Provider  cyclobenzaprine (FLEXERIL) 5 MG tablet Take 1 tablet (5 mg total) by mouth 3 (three) times daily as needed. 05/08/20  Yes Felma Pfefferle, Charlesetta Ivory, PA-C  Biotin 1000 MCG CHEW Chew by mouth daily.    [provider]  diphenhydrAMINE HCl (BENADRYL ALLERGY PO) Take 200 mg by mouth once a week.    [provider]  FLUoxetine (PROZAC) 20 MG tablet Take 20 mg by mouth daily.    [provider]  gabapentin (NEURONTIN) 400 MG capsule Take 1 capsule (400 mg total) by mouth 3 (three) times daily. Patient not taking: Reported on 10/08/2017 04/05/16 05/08/20  Deeann Saint, MD    Allergies Elemental sulfur, Penicillins, Sulfa antibiotics, and Latex  Family History  Problem Relation Age of Onset  . Breast cancer Maternal Grandmother     Social History Social History   Tobacco Use  . Smoking status: Current Every Day Smoker    Packs/day: 0.25    Years: 10.00    Pack years: 2.50    Types: Cigarettes  . Smokeless tobacco: Never Used  Vaping Use  . Vaping Use: Never used  Substance Use Topics  . Alcohol use: Not Currently    Alcohol/week: 2.0 standard drinks    Types: 2 Cans of beer per week  . Drug use: Not Currently    Comment:  pt reports hx of drug use, refused to specify what substances    Review of Systems  Constitutional: Negative for fever. Eyes: Negative for visual changes. ENT: Negative for sore throat. Cardiovascular: Negative for chest pain. Respiratory: Negative for shortness of breath. Gastrointestinal: Negative for abdominal pain, vomiting and diarrhea. Genitourinary: Negative for dysuria. Musculoskeletal: Negative for  back pain. Reports neck pain as above Skin: Negative for rash. Neurological: Negative for headaches, focal weakness or numbness. ____________________________________________  PHYSICAL EXAM:  VITAL SIGNS: ED Triage Vitals  Enc Vitals Group     BP 05/08/20 1541 120/79     Pulse Rate 05/08/20 1541 78     Resp 05/08/20 1541 16     Temp 05/08/20 1541 98.3 F (36.8 C)     Temp Source 05/08/20 1541 Oral     SpO2 05/08/20 1541 99 %     Weight 05/08/20 1542 210 lb (95.3 kg)     Height 05/08/20 1542 5\' 4"  (1.626 m)     Head Circumference --      Peak Flow --      Pain Score 05/08/20 1542 0     Pain Loc --      Pain Edu? --      Excl. in GC? --     Constitutional: Alert and oriented. Well appearing and in no distress. Head: Normocephalic and atraumatic. Eyes: Conjunctivae are normal. PERRL. Normal extraocular movements Ears: Canals clear. TMs intact bilaterally. Nose: No congestion/rhinorrhea/epistaxis. Mouth/Throat: Mucous membranes are moist. Neck: Supple. No thyromegaly.  No nuchal rigidity. Normal range of motion without crepitus.  No blood tenderness is appreciated.  No significant tenderness palpation of the paraspinal musculatures of trapezius musculature. Hematological/Lymphatic/Immunological: No cervical lymphadenopathy. Cardiovascular: Normal rate, regular rhythm. Normal distal pulses. Respiratory: Normal respiratory effort. No wheezes/rales/rhonchi. Gastrointestinal: Soft and nontender. No distention. Musculoskeletal: Nontender with normal range of motion in all extremities.  Neurologic: Cranial nerves II to XII grossly intact.  Normal UE/LE DTRs bilaterally.  Normal gait without ataxia. Normal speech and language. No gross focal neurologic deficits are appreciated. Skin:  Skin is warm, dry and intact. No rash noted. Psychiatric: Mood and affect are normal. Patient exhibits appropriate insight and judgment. ____________________________________________   LABS (pertinent  positives/negatives) Labs Reviewed  LIPASE, BLOOD - Abnormal; Notable for the following components:      Result Value   Lipase 52 (*)    All other components within normal limits  CBC - Abnormal; Notable for the following components:   Hemoglobin 9.9 (*)    HCT 31.6 (*)    MCV 77.5 (*)    MCH 24.3 (*)    RDW 18.8 (*)    Platelets 433 (*)    All other components within normal limits  URINALYSIS, COMPLETE (UACMP) WITH MICROSCOPIC - Abnormal; Notable for the following components:   Color, Urine YELLOW (*)    APPearance HAZY (*)    Bacteria, UA FEW (*)    All other components within normal limits  COMPREHENSIVE METABOLIC PANEL  POC URINE PREG, ED  ____________________________________________   RADIOLOGY  CT Head w/o CM  IMPRESSION: No acute intracranial abnormality. ____________________________________________  PROCEDURES  IBU 800 mg PO Reglan 10 mg PO  Procedures ____________________________________________  INITIAL IMPRESSION / ASSESSMENT AND PLAN / ED COURSE  DDX: torticollis, general headache, cervical strain, [facility]  Patient with ED evaluation of multiple complaints.  Primarily headache and neck stiffness.  Patient clinical patient was benign reassuring as it shows no signs of fever, nuchal rigidity, or meningeal  signs.  No acute neuro muscle deficits are appreciated.  CT scan is negative for any acute findings.  Labs also without any signs of acute electrolyte abnormality or elevated white count.  Patient is not in the room at the time of disposition, but labs and CT are reassuring at this time.  She had been submitted the prescription for amoxicillin to take as prescribed.  She will follow up with primary provider return to the ED if needed.   Christy Williams was evaluated in Emergency Department on 05/08/2020 for the symptoms described in the history of present illness. She was evaluated in the context of the global COVID-19 pandemic, which necessitated consideration  that the patient might be at risk for infection with the SARS-CoV-2 virus that causes COVID-19. Institutional protocols and algorithms that pertain to the evaluation of patients at risk for COVID-19 are in a state of rapid change based on information released by regulatory bodies including the CDC and federal and state organizations. These policies and algorithms were followed during the patient's care in the ED. ____________________________________________  FINAL CLINICAL IMPRESSION(S) / ED DIAGNOSES  Final diagnoses:  Torticollis  Acute nonintractable headache, unspecified headache type      Lissa Hoard, PA-C 05/08/20 2205    Minna Antis, MD 05/08/20 [phone]

## 2020-05-08 NOTE — ED Triage Notes (Addendum)
Patient c/o neck stiffness and pain that started yesterday and headache. C/o blurry vision for years. Also c/o left sided lower back pain that radiates down leg. Reports two years ago she had to have fluid removed from her spine and reports symptoms are the same. Reports itchy scalp and hair loss. Patient eating candy bar and drinking drink during triage. Also requesting note for work. A&O x4 in triage. Able to ambulate no problems.

## 2020-05-08 NOTE — ED Notes (Signed)
Patient is alert and oriented x4, ambulatory, and in no acute distress. Will continue to monitor and assess. 

## 2020-05-08 NOTE — Discharge Instructions (Signed)
Your exam, labs, and CT scan were all normal and reassuring at this time.  You are being treated for muscle spasm in the neck with a muscle relaxant.  Use caution when dosing this medication as it can cause drowsiness.  You should not drive after taking this medication.  Take over-the-counter Tylenol or Motrin as needed for headache pain relief.  Follow-up with your primary provider for ongoing symptoms.  Return to the ED if necessary.

## 2021-11-16 ENCOUNTER — Other Ambulatory Visit: Payer: Self-pay

## 2021-11-16 ENCOUNTER — Emergency Department: Payer: Medicaid Other

## 2021-11-16 ENCOUNTER — Emergency Department
Admission: EM | Admit: 2021-11-16 | Discharge: 2021-11-16 | Disposition: A | Discharge status: Home / Self Care | Payer: Medicaid Other | Attending: Emergency Medicine | Admitting: Emergency Medicine

## 2021-11-16 DIAGNOSIS — R42 Dizziness and giddiness: Secondary | ICD-10-CM | POA: Diagnosis not present

## 2021-11-16 DIAGNOSIS — G4489 Other headache syndrome: Secondary | ICD-10-CM | POA: Diagnosis not present

## 2021-11-16 DIAGNOSIS — R519 Headache, unspecified: Secondary | ICD-10-CM | POA: Diagnosis present

## 2021-11-16 LAB — URINE DRUG SCREEN, QUALITATIVE (ARMC ONLY)
Amphetamines, Ur Screen: NOT DETECTED
Barbiturates, Ur Screen: NOT DETECTED
Benzodiazepine, Ur Scrn: NOT DETECTED
Cannabinoid 50 Ng, Ur ~~LOC~~: NOT DETECTED
Cocaine Metabolite,Ur ~~LOC~~: NOT DETECTED
MDMA (Ecstasy)Ur Screen: NOT DETECTED
Methadone Scn, Ur: NOT DETECTED
Opiate, Ur Screen: NOT DETECTED
Phencyclidine (PCP) Ur S: NOT DETECTED
Tricyclic, Ur Screen: POSITIVE — AB

## 2021-11-16 LAB — COMPREHENSIVE METABOLIC PANEL
ALT: 13 U/L (ref 0–44)
AST: 19 U/L (ref 15–41)
Albumin: 3.9 g/dL (ref 3.5–5.0)
Alkaline Phosphatase: 48 U/L (ref 38–126)
Anion gap: 7 (ref 5–15)
BUN: 11 mg/dL (ref 6–20)
CO2: 25 mmol/L (ref 22–32)
Calcium: 9.1 mg/dL (ref 8.9–10.3)
Chloride: 107 mmol/L (ref 98–111)
Creatinine, Ser: 0.6 mg/dL (ref 0.44–1.00)
GFR, Estimated: >60 mL/min (ref >60)
Glucose, Bld: 112 mg/dL — ABNORMAL HIGH (ref 70–99)
Potassium: 3.8 mmol/L (ref 3.5–5.1)
Sodium: 139 mmol/L (ref 135–145)
Total Bilirubin: 0.3 mg/dL (ref 0.3–1.2)
Total Protein: 7.7 g/dL (ref 6.5–8.1)

## 2021-11-16 LAB — URINALYSIS, ROUTINE W REFLEX MICROSCOPIC
Bilirubin Urine: NEGATIVE
Glucose, UA: NEGATIVE mg/dL
Hgb urine dipstick: NEGATIVE
Ketones, ur: NEGATIVE mg/dL
Leukocytes,Ua: NEGATIVE
Nitrite: NEGATIVE
Protein, ur: NEGATIVE mg/dL
Specific Gravity, Urine: 1.024 (ref 1.005–1.030)
pH: 5 (ref 5.0–8.0)

## 2021-11-16 LAB — CBC WITH DIFFERENTIAL/PLATELET
Abs Immature Granulocytes: 0.03 10*3/uL (ref 0.00–0.07)
Basophils Absolute: 0.1 10*3/uL (ref 0.0–0.1)
Basophils Relative: 1 %
Eosinophils Absolute: 0.3 10*3/uL (ref 0.0–0.5)
Eosinophils Relative: 4 %
HCT: 32.1 % — ABNORMAL LOW (ref 36.0–46.0)
Hemoglobin: 9.8 g/dL — ABNORMAL LOW (ref 12.0–15.0)
Immature Granulocytes: 0 %
Lymphocytes Relative: 37 %
Lymphs Abs: 3 10*3/uL (ref 0.7–4.0)
MCH: 24.1 pg — ABNORMAL LOW (ref 26.0–34.0)
MCHC: 30.5 g/dL (ref 30.0–36.0)
MCV: 79.1 fL — ABNORMAL LOW (ref 80.0–100.0)
Monocytes Absolute: 0.6 10*3/uL (ref 0.1–1.0)
Monocytes Relative: 8 %
Neutro Abs: 4 10*3/uL (ref 1.7–7.7)
Neutrophils Relative %: 50 %
Platelets: 465 10*3/uL — ABNORMAL HIGH (ref 150–400)
RBC: 4.06 MIL/uL (ref 3.87–5.11)
RDW: 18.2 % — ABNORMAL HIGH (ref 11.5–15.5)
WBC: 8 10*3/uL (ref 4.0–10.5)
nRBC: 0 % (ref 0.0–0.2)

## 2021-11-16 LAB — ACETAMINOPHEN LEVEL: Acetaminophen (Tylenol), Serum: <10 ug/mL — ABNORMAL LOW (ref 10–30)

## 2021-11-16 LAB — ETHANOL: Alcohol, Ethyl (B): <10 mg/dL (ref <10)

## 2021-11-16 LAB — SALICYLATE LEVEL: Salicylate Lvl: <7 mg/dL — ABNORMAL LOW (ref 7.0–30.0)

## 2021-11-16 LAB — PROTIME-INR
INR: 1 (ref 0.8–1.2)
Prothrombin Time: 13.3 seconds (ref 11.4–15.2)

## 2021-11-16 MED ORDER — KETOROLAC TROMETHAMINE 30 MG/ML IJ SOLN
30.0000 mg | Freq: Once | INTRAMUSCULAR | Status: AC
  Administered 2021-11-16: 30 mg via INTRAMUSCULAR
  Filled 2021-11-16: qty 1

## 2021-11-16 MED ORDER — IOHEXOL 300 MG/ML  SOLN
75.0000 mL | Freq: Once | INTRAMUSCULAR | Status: AC | PRN
  Administered 2021-11-16: 75 mL via INTRAVENOUS

## 2021-11-16 NOTE — ED Triage Notes (Signed)
Reports headache dizziness x 1.5 weeks.  Also complains of neck back pain and losing her voice.  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  This happened two years ago.

## 2021-11-16 NOTE — ED Notes (Signed)
Pt verbalized understanding of discharge instructions and follow-up care instructions. Pt ambulatory with steady gait upon discharge.

## 2021-11-16 NOTE — ED Provider Triage Note (Signed)
Emergency Medicine Provider Triage Evaluation Note  Christy Williams , a 46 y.o. female  was evaluated in triage.  Pt complains of who presents the emergency department complaining of headache, dizziness, light sensitivity.  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  However there is no records in the patient's chart from this occurred.  She states that they used a "syringe" to remove the clot.  She also states that she had a central nervous system infection which apparently sounds like meningitis but states that this was secondary to a bug crawling through her hair and penetrating through her skull.  Patient is complaining of both right-sided and then left-sided headache.  No reported numbness or tingling in the upper or lower extremities.  No loss of consciousness..  Patient then starts talking about other random symptoms such as losing her voice, being stressed that her boyfriend is out of town.  Patient denies any intention of hurting herself or others.  Review of Systems  Positive: Headache, photophobia Negative: Numbness, tingling, loss of consciousness, fevers, chills  Physical Exam  BP (!) 150/98 (BP Location: Left Arm)   Pulse (!) 101   Temp 98.6 F (37 C) (Oral)   Resp 20   Ht 5\' 4"  (1.626 m)   Wt 95.3 kg   SpO2 96%   BMI 36.05 kg/m  Gen:   Awake, no distress   Resp:  Normal effort  MSK:   Moves extremities without difficulty  Other:  Cranial nerves II through XII grossly intact.  Negative Romberg's and pronator drift  Medical Decision Making  Medically screening exam initiated at 4:46 PM.  Appropriate orders placed.  Allena Chinita Pester was informed that the remainder of the evaluation will be completed by another provider, this initial triage assessment does not replace that evaluation, and the importance of remaining in the ED until  their evaluation is complete.  Patient presents with headache, dizziness, photophobia.  Patient states that symptoms have been ongoing x10 days.  Reportedly has a history of a clot though there is no reports of the procedure the patient mentioned having at this hospital or any other surrounding hospital.  Patient then started endorsing that this clot came from a blood clot ongoing through her hair and into her skull.  Becomes very disorganized in thoughts after this.  No thoughts of hurting herself or others.  Patient will have labs, imaging of her head.   Darletta Moll, PA-C 11/16/21 1648

## 2021-11-16 NOTE — ED Provider Notes (Signed)
[facility] Provider Note  Patient Contact: 7:19 PM (approximate)   History   Headache and Dizziness   HPI  Christy Williams is a 46 y.o. female with a history of anemia, anxiety and PTSD, presents to the emergency department with a bandlike headache across her forehead, photophobia, dizziness and difficulty concentrating for the last week.  She states that she noticed symptoms starting about 10 days ago.  She states that she has a history of a "brain blood clot" that was evacuated in this hospital.  She also states that she had a disc issue in her back but states that she does not have record of this and she is worried that people are going to think that she has "made this up".  She denies chest pain, chest tightness or shortness of breath.  No fever or chills.  No vomiting or diarrhea at home.      Physical Exam   Triage Vital Signs: ED Triage Vitals  Enc Vitals Group     BP 11/16/21 1629 (!) 150/98     Pulse Rate 11/16/21 1629 (!) 101     Resp 11/16/21 1629 20     Temp 11/16/21 1629 98.6 F (37 C)     Temp Source 11/16/21 1629 Oral     SpO2 11/16/21 1629 96 %     Weight 11/16/21 1627 210 lb (95.3 kg)     Height 11/16/21 1627 5\' 4"  (1.626 m)     Head Circumference --      Peak Flow --      Pain Score 11/16/21 1627 8     Pain Loc --      Pain Edu? --      Excl. in [place]? --     Most recent vital signs: Vitals:   11/16/21 1629  BP: (!) 150/98  Pulse: (!) 101  Resp: 20  Temp: 98.6 F (37 C)  SpO2: 96%     General: Alert and in no acute distress. Eyes:  PERRL. EOMI. Head: No acute traumatic findings ENT:      Nose: No congestion/rhinnorhea.      Mouth/Throat: Mucous membranes are moist. Neck: No stridor. No cervical spine tenderness to palpation. Cardiovascular:  Good peripheral perfusion Respiratory: Normal respiratory effort without tachypnea or retractions. Lungs CTAB. Good air entry to the bases with no decreased or absent breath  sounds. Gastrointestinal: Bowel sounds 4 quadrants. Soft and nontender to palpation. No guarding or rigidity. No palpable masses. No distention. No CVA tenderness. Musculoskeletal: Full range of motion to all extremities.  Neurologic:  No gross focal neurologic deficits are appreciated.  Skin:   No rash noted Other:   ED Results / Procedures / Treatments   Labs (all labs ordered are listed, but only abnormal results are displayed) Labs Reviewed  COMPREHENSIVE METABOLIC PANEL - Abnormal; Notable for the following components:      Result Value   Glucose, Bld 112 (*)    All other components within normal limits  CBC WITH DIFFERENTIAL/PLATELET - Abnormal; Notable for the following components:   Hemoglobin 9.8 (*)    HCT 32.1 (*)    MCV 79.1 (*)    MCH 24.1 (*)    RDW 18.2 (*)    Platelets 465 (*)    All other components within normal limits  SALICYLATE LEVEL - Abnormal; Notable for the following components:   Salicylate Lvl <1.6 (*)    All other components within normal limits  ACETAMINOPHEN LEVEL -  Abnormal; Notable for the following components:   Acetaminophen (Tylenol), Serum <10 (*)    All other components within normal limits  URINALYSIS, ROUTINE W REFLEX MICROSCOPIC - Abnormal; Notable for the following components:   Color, Urine YELLOW (*)    APPearance HAZY (*)    All other components within normal limits  URINE DRUG SCREEN, QUALITATIVE ([facility] ONLY) - Abnormal; Notable for the following components:   Tricyclic, Ur Screen POSITIVE (*)    All other components within normal limits  ETHANOL  PROTIME-INR       RADIOLOGY  I personally viewed and evaluated these images as part of my medical decision making, as well as reviewing the written report by the radiologist.  ED Provider Interpretation: CT head without contrast shows no evidence of intracranial bleeding.  CT head venogram shows no evidence of venous sinus thrombosis.   PROCEDURES:  Critical Care performed:  No  Procedures   MEDICATIONS ORDERED IN ED: Medications  ketorolac (TORADOL) 30 MG/ML injection 30 mg (has no administration in time range)  iohexol (OMNIPAQUE) 300 MG/ML solution 75 mL (75 mLs Intravenous Contrast Given 11/16/21 1858)     IMPRESSION / MDM / ASSESSMENT AND PLAN / ED COURSE  I reviewed the triage vital signs and the nursing notes.                              Assessment and plan Headache 46 year old female with past medical history detailed above presents to the emergency department with frontal headache, difficulty concentrating, photophobia and dizziness.  Patient was mildly tachycardic and hypertensive at triage but vital signs were otherwise reassuring.   CBC indicates anemia consistent with patient's baseline but was otherwise reassuring.  CMP within reference range.  Urinalysis shows no signs of UTI.  PT/INR within range.  CT head shows no acute abnormality.  I discussed patient case with attending Dr. Erma Heritage and we agreed to proceed with CT venogram  CT venogram unremarkable.  Patient was given an injection of Toradol for headache.  Return precautions were given to return with new or worsening symptoms.  FINAL CLINICAL IMPRESSION(S) / ED DIAGNOSES   Final diagnoses:  Other headache syndrome     Rx / DC Orders   ED Discharge Orders     None        Note:  This document was prepared using Dragon voice recognition software and may include unintentional dictation errors.   Pia Mau [place], PA-C 11/16/21 Sable Feil    Shaune Pollack, MD 11/25/21 [phone]

## 2022-06-06 ENCOUNTER — Other Ambulatory Visit: Payer: Self-pay

## 2022-06-06 ENCOUNTER — Emergency Department
Admission: EM | Admit: 2022-06-06 | Discharge: 2022-06-06 | Disposition: A | Discharge status: Home / Self Care | Payer: Medicaid Other | Attending: Emergency Medicine | Admitting: Emergency Medicine

## 2022-06-06 ENCOUNTER — Emergency Department: Payer: Medicaid Other

## 2022-06-06 DIAGNOSIS — M62838 Other muscle spasm: Secondary | ICD-10-CM | POA: Insufficient documentation

## 2022-06-06 DIAGNOSIS — R519 Headache, unspecified: Secondary | ICD-10-CM | POA: Diagnosis present

## 2022-06-06 LAB — CBC WITH DIFFERENTIAL/PLATELET
Abs Immature Granulocytes: 0.03 10*3/uL (ref 0.00–0.07)
Basophils Absolute: 0.1 10*3/uL (ref 0.0–0.1)
Basophils Relative: 1 %
Eosinophils Absolute: 0.3 10*3/uL (ref 0.0–0.5)
Eosinophils Relative: 4 %
HCT: 32.5 % — ABNORMAL LOW (ref 36.0–46.0)
Hemoglobin: 10 g/dL — ABNORMAL LOW (ref 12.0–15.0)
Immature Granulocytes: 0 %
Lymphocytes Relative: 35 %
Lymphs Abs: 3.2 10*3/uL (ref 0.7–4.0)
MCH: 22.4 pg — ABNORMAL LOW (ref 26.0–34.0)
MCHC: 30.8 g/dL (ref 30.0–36.0)
MCV: 72.9 fL — ABNORMAL LOW (ref 80.0–100.0)
Monocytes Absolute: 0.8 10*3/uL (ref 0.1–1.0)
Monocytes Relative: 9 %
Neutro Abs: 4.6 10*3/uL (ref 1.7–7.7)
Neutrophils Relative %: 51 %
Platelets: 507 10*3/uL — ABNORMAL HIGH (ref 150–400)
RBC: 4.46 MIL/uL (ref 3.87–5.11)
RDW: 20.4 % — ABNORMAL HIGH (ref 11.5–15.5)
WBC: 9 10*3/uL (ref 4.0–10.5)
nRBC: 0 % (ref 0.0–0.2)

## 2022-06-06 LAB — BASIC METABOLIC PANEL
Anion gap: 7 (ref 5–15)
BUN: 11 mg/dL (ref 6–20)
CO2: 23 mmol/L (ref 22–32)
Calcium: 8.6 mg/dL — ABNORMAL LOW (ref 8.9–10.3)
Chloride: 108 mmol/L (ref 98–111)
Creatinine, Ser: 0.65 mg/dL (ref 0.44–1.00)
GFR, Estimated: >60 mL/min (ref >60)
Glucose, Bld: 79 mg/dL (ref 70–99)
Potassium: 3.7 mmol/L (ref 3.5–5.1)
Sodium: 138 mmol/L (ref 135–145)

## 2022-06-06 MED ORDER — ONDANSETRON HCL 4 MG/2ML IJ SOLN
4.0000 mg | Freq: Once | INTRAMUSCULAR | Status: AC
  Administered 2022-06-06: 4 mg via INTRAVENOUS
  Filled 2022-06-06: qty 2

## 2022-06-06 MED ORDER — SODIUM CHLORIDE 0.9 % IV BOLUS
1000.0000 mL | Freq: Once | INTRAVENOUS | Status: AC
  Administered 2022-06-06: 1000 mL via INTRAVENOUS

## 2022-06-06 NOTE — ED Triage Notes (Signed)
Pt via POV from home. Pt states she has had sharp pain on the R side of her head started 2 days ago. Pt also endorse nausea. Pt has a hx of migraines. Denies any dizziness. Pt is A&Ox4 and NAD

## 2022-06-06 NOTE — ED Provider Notes (Signed)
[facility] Provider Note    Event Date/Time   First MD Initiated Contact with Patient 06/06/22 1315     (approximate)   History   Headache   HPI  Christy Williams is a 47 y.o. female with history of blood clot in her brain 2 years ago presents emergency department complaining of a headache that shoots up from the posterior part of her scalp to the front.  No pain in her shoulders or neck.  Patient states she used to be on a blood thinner but ran out of the medication and just quit taking it.  Patient denies fever or chills.  No vomiting.  Dates her speech has been a little different lately.  But will not give me a accurate timeframe.  Also states her vision changed from 2 years ago because she had to get new glasses.  Not sure that this correlates with her new onset of headache.      Physical Exam   Triage Vital Signs: ED Triage Vitals  Enc Vitals Group     BP 06/06/22 1230 125/83     Pulse Rate 06/06/22 1230 98     Resp 06/06/22 1230 16     Temp 06/06/22 1230 98.2 F (36.8 C)     Temp Source 06/06/22 1230 Oral     SpO2 06/06/22 1230 98 %     Weight 06/06/22 1228 213 lb (96.6 kg)     Height 06/06/22 1228 5\' 4"  (1.626 m)     Head Circumference --      Peak Flow --      Pain Score 06/06/22 1228 7     Pain Loc --      Pain Edu? --      Excl. in GC? --     Most recent vital signs: Vitals:   06/06/22 1230  BP: 125/83  Pulse: 98  Resp: 16  Temp: 98.2 F (36.8 C)  SpO2: 98%     General: Awake, no distress.   CV:  Good peripheral perfusion. regular rate and  rhythm Resp:  Normal effort.  Abd:  No distention.   Other:  PERRL, EOMI, skull is nontender, neck is nontender, spasms noted in trapezius bilaterally but patient states it is nontender upon my palpation   ED Results / Procedures / Treatments   Labs (all labs ordered are listed, but only abnormal results are displayed) Labs Reviewed  BASIC METABOLIC PANEL - Abnormal; Notable  for the following components:      Result Value   Calcium 8.6 (*)    All other components within normal limits  CBC WITH DIFFERENTIAL/PLATELET - Abnormal; Notable for the following components:   Hemoglobin 10.0 (*)    HCT 32.5 (*)    MCV 72.9 (*)    MCH 22.4 (*)    RDW 20.4 (*)    Platelets 507 (*)    All other components within normal limits     EKG     RADIOLOGY CT of the head    PROCEDURES:   Procedures   MEDICATIONS ORDERED IN ED: Medications  sodium chloride 0.9 % bolus 1,000 mL (1,000 mLs Intravenous New Bag/Given 06/06/22 1426)  ondansetron (ZOFRAN) injection 4 mg (4 mg Intravenous Given 06/06/22 1426)     IMPRESSION / MDM / ASSESSMENT AND PLAN / ED COURSE  I reviewed the triage vital signs and the nursing notes.  Differential diagnosis includes, but is not limited to, CVA, dural, abscess, migraine  Patient's presentation is most consistent with acute presentation with potential threat to life or bodily function.   Basic labs, CT of the head, normal saline 1 L IV and Zofran 4 mg IV   Labs are reassuring, hemoglobin is low but this is patient's normal baseline  CT of the head, I did independently review and interpret the radiologist reading as being negative for any acute abnormality.  The patient is feeling better after the fluids and Zofran.  Did explain all of her lab findings and CT findings.  They appear to be normal.  She is to follow-up with her regular doctor as needed.  Return emergency department worsening.  Do not feel that she needs an admission as she appears to be hemodynamically stable.  She is in agreement treatment plan.  Discharged stable condition with a work note   FINAL CLINICAL IMPRESSION(S) / ED DIAGNOSES   Final diagnoses:  Bad headache     Rx / DC Orders   ED Discharge Orders     None        Note:  This document was prepared using Dragon voice recognition software and may include  unintentional dictation errors.    Faythe Ghee, PA-C 06/06/22 1455    Corena Herter, MD 06/06/22 2037

## 2022-06-06 NOTE — Discharge Instructions (Signed)
Follow-up with your regular doctor as needed.  Return if worsening. ?

## 2023-04-14 ENCOUNTER — Other Ambulatory Visit: Payer: Self-pay

## 2023-04-14 ENCOUNTER — Emergency Department

## 2023-04-14 ENCOUNTER — Emergency Department
Admission: EM | Admit: 2023-04-14 | Discharge: 2023-04-14 | Disposition: A | Discharge status: Home / Self Care | Attending: Emergency Medicine | Admitting: Emergency Medicine

## 2023-04-14 DIAGNOSIS — L299 Pruritus, unspecified: Secondary | ICD-10-CM | POA: Diagnosis not present

## 2023-04-14 DIAGNOSIS — S46912A Strain of unspecified muscle, fascia and tendon at shoulder and upper arm level, left arm, initial encounter: Secondary | ICD-10-CM | POA: Diagnosis not present

## 2023-04-14 DIAGNOSIS — X58XXXA Exposure to other specified factors, initial encounter: Secondary | ICD-10-CM | POA: Diagnosis not present

## 2023-04-14 DIAGNOSIS — S4991XA Unspecified injury of right shoulder and upper arm, initial encounter: Secondary | ICD-10-CM | POA: Diagnosis present

## 2023-04-14 MED ORDER — KETOCONAZOLE 2 % EX SHAM: 1.0000 | MEDICATED_SHAMPOO | CUTANEOUS | 0 refills | Status: AC

## 2023-04-14 NOTE — ED Triage Notes (Signed)
 Pt to ed from home via POV for shoulder pain in left shoulder x 1 week. 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 Pt is caox4, in no acute distress and ambulatory in triage. Pt also has pain in the right side x 1 month. But the left is more bothersome and she cannot lift it as to why she is here today.

## 2023-04-14 NOTE — Discharge Instructions (Signed)
 You were seen in the ER today for evaluation of your shoulder pain.  Your x-Christy Williams did not show any bony injuries.  Follow-up with orthopedics for further evaluation.  You were given a sling for comfort, but is important that you reinjure shoulder as best as you are able regularly to prevent stiffening.  I also sent a prescription for shampoo to your pharmacy to see if this helps with your itchy scalp.  Follow-up your primary care doctor for further evaluation.  Return to the ER for new or worsening symptoms.

## 2023-04-14 NOTE — ED Provider Notes (Signed)
 [facility] Provider Note    Event Date/Time   First MD Initiated Contact with Patient 04/14/23 1623     (approximate)   History   Shoulder Pain (LEFT x 1 week)   HPI  Christy Williams is a 48 year old female presenting to the emergency department for evaluation of shoulder pain.  Over the last week, she has had difficulty fully ranging her left shoulder.  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  No falls or injury to the area.  Additionally reports that she has had some itchiness and hair loss over the right side of her scalp.    Physical Exam   Triage Vital Signs: ED Triage Vitals  Encounter Vitals Group     BP 04/14/23 1527 118/75     Systolic BP Percentile --      Diastolic BP Percentile --      Pulse Rate 04/14/23 1527 98     Resp 04/14/23 1527 14     Temp 04/14/23 1527 98.2 F (36.8 C)     Temp Source 04/14/23 1527 Oral     SpO2 04/14/23 1527 100 %     Weight 04/14/23 1528 211 lb 10.3 oz (96 kg)     Height 04/14/23 1528 5\' 4"  (1.626 m)     Head Circumference --      Peak Flow --      Pain Score 04/14/23 1527 6     Pain Loc --      Pain Education --      Exclude from Growth Chart --     Most recent vital signs: Vitals:   04/14/23 1527  BP: 118/75  Pulse: 98  Resp: 14  Temp: 98.2 F (36.8 C)  SpO2: 100%     General: Awake, interactive  CV:  Regular rate, good peripheral perfusion.  Resp:  Unlabored respirations.  Abd:  Nondistended.  Neuro:  Symmetric facial movement, fluid speech MSK:  Pain and limited range of motion at shoulder with lateral raise and anterior motion.  No point area of tenderness.  2+ radial pulses bilaterally.  Sensation intact throughout extremities. Skin:  Area of decreased hair thickness over the right scalp compared to the contralateral side without underlying erythema, scaling ED Results / Procedures / Treatments   Labs (all labs ordered are listed, but only  abnormal results are displayed) Labs Reviewed - No data to display   EKG EKG independently reviewed interpreted by myself (ER attending) demonstrates:    RADIOLOGY Imaging independently reviewed and interpreted by myself demonstrates:    PROCEDURES:  Critical Care performed: No  Procedures   MEDICATIONS ORDERED IN ED: Medications - No data to display   IMPRESSION / MDM / ASSESSMENT AND PLAN / ED COURSE  I reviewed the triage vital signs and the nursing notes.  Differential diagnosis includes, but is not limited to, musculoskeletal strain, rotator cuff pathology, adhesive capsulitis, regarding scalp no evidence of cellulitis, abscess,   Patient's presentation is most consistent with acute complicated illness / injury requiring diagnostic workup.  48 year old female presenting with shoulder pain and scalp itching.  Regarding shoulder pain, x-Suzan Manon ordered from triage negative for acute bony pathology.  Suspect pain and limited range of motion may be due to her repetitive movements.  Will provide sling for comfort, but did discuss importance of regular range of motion to prevent contractures.  Instructed to follow-up with orthopedics for further evaluation.  Reports she has previously seen [facility].  Regarding itchy scalp, exam overall reassuring, will trial ketoconazole shampoo.      FINAL CLINICAL IMPRESSION(S) / ED DIAGNOSES   Final diagnoses:  Strain of left shoulder, initial encounter  Itchy scalp     Rx / DC Orders   ED Discharge Orders          Ordered    ketoconazole (NIZORAL) 2 % shampoo  2 times weekly        04/14/23 1642             Note:  This document was prepared using Dragon voice recognition software and may include unintentional dictation errors.   Trinna Post, MD 04/14/23 [phone]

## 2024-01-30 ENCOUNTER — Emergency Department
Admission: EM | Admit: 2024-01-30 | Discharge: 2024-01-30 | Disposition: A | Discharge status: Home / Self Care | Payer: MEDICAID | Attending: Emergency Medicine | Admitting: Emergency Medicine

## 2024-01-30 ENCOUNTER — Encounter: Payer: Self-pay | Admitting: Emergency Medicine

## 2024-01-30 ENCOUNTER — Emergency Department: Payer: MEDICAID

## 2024-01-30 ENCOUNTER — Other Ambulatory Visit: Payer: Self-pay

## 2024-01-30 DIAGNOSIS — J101 Influenza due to other identified influenza virus with other respiratory manifestations: Secondary | ICD-10-CM | POA: Diagnosis not present

## 2024-01-30 DIAGNOSIS — R519 Headache, unspecified: Secondary | ICD-10-CM | POA: Diagnosis present

## 2024-01-30 LAB — RESP PANEL BY RT-PCR (RSV, FLU A&B, COVID)  RVPGX2
Influenza A by PCR: POSITIVE — AB
Influenza B by PCR: NEGATIVE
Resp Syncytial Virus by PCR: NEGATIVE
SARS Coronavirus 2 by RT PCR: NEGATIVE

## 2024-01-30 MED ORDER — CYCLOBENZAPRINE HCL 10 MG PO TABS
5.0000 mg | ORAL_TABLET | Freq: Once | ORAL | Status: AC
  Administered 2024-01-30: 5 mg via ORAL
  Filled 2024-01-30: qty 1

## 2024-01-30 MED ORDER — ACETAMINOPHEN 325 MG PO TABS
650.0000 mg | ORAL_TABLET | Freq: Once | ORAL | Status: AC
  Administered 2024-01-30: 650 mg via ORAL
  Filled 2024-01-30: qty 2

## 2024-01-30 MED ORDER — KETOROLAC TROMETHAMINE 10 MG PO TABS: 10.0000 mg | ORAL_TABLET | Freq: Four times a day (QID) | ORAL | 0 refills | Status: AC | PRN

## 2024-01-30 MED ORDER — KETOROLAC TROMETHAMINE 30 MG/ML IJ SOLN
30.0000 mg | Freq: Once | INTRAMUSCULAR | Status: AC
  Administered 2024-01-30: 30 mg via INTRAMUSCULAR
  Filled 2024-01-30: qty 1

## 2024-01-30 MED ORDER — SODIUM CHLORIDE 0.9 % IV BOLUS
500.0000 mL | Freq: Once | INTRAVENOUS | Status: AC
  Administered 2024-01-30: 500 mL via INTRAVENOUS

## 2024-01-30 NOTE — ED Notes (Signed)
 See triage note  Presents with fever and body aches   Also having a cough with some SOB  Sxs' started this am

## 2024-01-30 NOTE — ED Triage Notes (Signed)
 First nurse note: pt to ED [facility] from home for flu like sx, h/a, started this am. VSS with EMS.

## 2024-01-30 NOTE — Discharge Instructions (Addendum)
 You were seen in the emergency department today for a cough. Your respiratory panel is positive for influenza A.  Your chest x-ray is normal.  A viral cough may last up to 2-3 weeks.   Take tylenol  or ibuprofen  for pain or fever as directed.   Stay hydrated by drinking plenty of fluids to thin mucus. Get adequate amount of sleep and avoid overexertion. Consider a humidifier at night. Warm teas and a spoonful of honey may help reduce cough frequency. Follow up with your primary care provider as needed.   SORE THROAT  Use throat lozenges or Chloraseptic spray.  Gargle with warm salt water several times daily  Pain control:  Ibuprofen  (motrin /aleve/advil ) - You can take 3 tablets (600 mg) every 6 hours as needed for pain/fever.  Acetaminophen  (tylenol ) - You can take 2 extra strength tablets (1000 mg) every 6 hours as needed for pain/fever.  You can alternate these medications or take them together.  Make sure you eat food/drink water when taking these medications.  Do not take ibuprofen  with prescribed medication Toradol .

## 2024-01-30 NOTE — ED Triage Notes (Addendum)
 Pt to Ed via POV from home. Pt reports HA, cough, body aches, and intermittent SOB from cough that started this am.   Pt declines tylenol  for fever in triage due to fear it will make stomach upset.

## 2024-01-30 NOTE — ED Provider Notes (Signed)
 "   [facility] Emergency Department Provider Note     Event Date/Time   First MD Initiated Contact with Patient 01/30/24 1357     (approximate)   History   Generalized Body Aches (/)   HPI  Christy Williams is a 48 y.o. female presents to the ED for evaluation of sore throat, headache localized to her frontal lobe and generalized body aches with onset of today. No sick contacts.  Denies abdominal pain, nausea, vomiting or urinary symptoms.     Physical Exam   Triage Vital Signs: ED Triage Vitals  Encounter Vitals Group     BP 01/30/24 1315 (!) 141/77     Girls Systolic BP Percentile --      Girls Diastolic BP Percentile --      Boys Systolic BP Percentile --      Boys Diastolic BP Percentile --      Pulse Rate 01/30/24 1315 (!) 101     Resp 01/30/24 1315 20     Temp 01/30/24 1315 (!) 101.4 F (38.6 C)     Temp Source 01/30/24 1514 Oral     SpO2 01/30/24 1315 97 %     Weight 01/30/24 1337 211 lb 10.3 oz (96 kg)     Height 01/30/24 1337 5' 4 (1.626 m)     Head Circumference --      Peak Flow --      Pain Score 01/30/24 1314 10     Pain Loc --      Pain Education --      Exclude from Growth Chart --     Most recent vital signs: Vitals:   01/30/24 1514 01/30/24 1745  BP: 139/78   Pulse: (!) 115 (!) 106  Resp: 20   Temp: 99.5 F (37.5 C)   SpO2: 98%     General: Obvious discomfort.  Alert and oriented. INAD.   Head:  NCAT.  Eyes:  PERRLA. EOMI.  Throat: Oropharynx clear. No erythema or exudates. Tonsils not enlarged. Uvula is midline. CV:  Good peripheral perfusion. RRR.  RESP:  Normal effort. LCTAB. No retractions.  ABD:  No distention. Soft, Non tender.  NEURO: Cranial nerves intact. No focal deficits. Speech is clear. Sensation and motor function intact. Gait is steady.   ED Results / Procedures / Treatments   Labs (all labs ordered are listed, but only abnormal results are displayed) Labs Reviewed  RESP PANEL BY RT-PCR  (RSV, FLU A&B, COVID)  RVPGX2 - Abnormal; Notable for the following components:      Result Value   Influenza A by PCR POSITIVE (*)    All other components within normal limits    RADIOLOGY  I personally viewed and evaluated these images as part of my medical decision making, as well as reviewing the written report by the radiologist.  ED Provider Interpretation: No focal consolidation on my interpretation.  DG Chest 2 View Result Date: 01/30/2024 EXAM: 2 VIEW(S) XRAY OF THE CHEST 01/30/2024 01:35:00 PM COMPARISON: CXR 03/10/2016. CLINICAL HISTORY: cough FINDINGS: LUNGS AND PLEURA: No focal pulmonary opacity. No pleural effusion. No pneumothorax. HEART AND MEDIASTINUM: No acute abnormality of the cardiac and mediastinal silhouettes. BONES AND SOFT TISSUES: Cholecystectomy clips noted. No acute osseous abnormality. IMPRESSION: 1. No acute cardiopulmonary process. Electronically signed by: Morgane Naveau MD 01/30/2024 01:39 PM EST RP Workstation: [id number]    PROCEDURES:  Critical Care performed: No  Procedures   MEDICATIONS ORDERED IN ED: Medications  ketorolac  (TORADOL ) 30  MG/ML injection 30 mg (30 mg Intramuscular Given 01/30/24 1420)  acetaminophen  (TYLENOL ) tablet 650 mg (650 mg Oral Given 01/30/24 1420)  sodium chloride  0.9 % bolus 500 mL (0 mLs Intravenous Stopped 01/30/24 1751)  cyclobenzaprine  (FLEXERIL ) tablet 5 mg (5 mg Oral Given 01/30/24 1622)     IMPRESSION / MDM / ASSESSMENT AND PLAN / ED COURSE  I reviewed the triage vital signs and the nursing notes.                              Clinical Course as of 01/30/24 1836  Thu Jan 30, 2024  1610 Pulse rate increase to 115. Will administer fluids [MH]    Clinical Course User Index [MH] Margrette Monte A, PA-C    48 y.o. female presents to the emergency department for evaluation and treatment of acute URI symptoms. See HPI for further details.   Differential diagnosis includes, but is not limited to viral URI,  influenza, COVID, PNA, tension headache  Patient's presentation is most consistent with acute complicated illness / injury requiring diagnostic workup.  Noted initial vital signs with pulse rate of 101 and febrile temp of 101.4.  Physical exam findings are stated above with no red flag signs.  Respiratory panel is positive for influenza A.  Chest x-ray is reassuring.  patient is treated with IV fluids, muscle relaxer, Tylenol  and Toradol .  Fever did improve.  Patient reports symptoms have decreased in intensity.  Education on symptomatic treatment was provided.  She verbalized understanding.  She is in stable condition for discharge home.  ED return precautions discussed.  FINAL CLINICAL IMPRESSION(S) / ED DIAGNOSES   Final diagnoses:  Influenza A   Rx / DC Orders   ED Discharge Orders          Ordered    ketorolac  (TORADOL ) 10 MG tablet  Every 6 hours PRN        01/30/24 1735           Note:  This document was prepared using Dragon voice recognition software and may include unintentional dictation errors.    Margrette, Braxten Memmer A, PA-C 01/30/24 1836    Waymond Lorelle Cummins, MD 01/30/24 [phone]  "
